# Patient Record
Sex: Male | Born: 2000
Health system: Southern US, Community
[De-identification: ages and names within clinical notes are randomized; demographics above are authoritative.]

---
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
## 2018-01-21 ENCOUNTER — Inpatient Hospital Stay: Attending: Psychiatry

## 2018-01-22 ENCOUNTER — Inpatient Hospital Stay
Admit: 2018-01-22 | Discharge: 2018-01-22 | Disposition: A | Discharge status: Home / Self Care | Payer: PRIVATE HEALTH INSURANCE | Source: Home / Self Care

## 2018-01-22 DIAGNOSIS — R4589 Other symptoms and signs involving emotional state: Secondary | ICD-10-CM

## 2018-01-22 DIAGNOSIS — G4700 Insomnia, unspecified: Secondary | ICD-10-CM

## 2018-01-22 LAB — Acetaminophen,Serum: ACETAMINOPHEN,SERUM: <10 ug/mL — ABNORMAL LOW (ref 10–20)

## 2018-01-22 LAB — Extra Gold Top

## 2018-01-22 LAB — Electrolyte Panel: SODIUM: 137 mmol/L (ref 135–146)

## 2018-01-22 LAB — Glucose, Whole Blood: GLUCOSE, WHOLE BLOOD: 90 mg/dL (ref 65–99)

## 2018-01-22 LAB — CBC: RED CELL DISTRIBUTION WIDTH-SD: 40 fL (ref 36.9–48.3)

## 2018-01-22 LAB — CREATININE: CREATININE: 0.74 mg/dL (ref 0.60–1.30)

## 2018-01-22 MED ORDER — GABAPENTIN 300 MG PO CAPS
300 mg | ORAL_CAPSULE | Freq: Three times a day (TID) | ORAL | 0 refills | Status: AC
Start: 2018-01-22 — End: 2018-03-05

## 2018-01-22 MED ORDER — TRAZODONE HCL 50 MG PO TABS
50 mg | ORAL_TABLET | Freq: Every evening | ORAL | 0 refills | Status: SS
Start: 2018-01-22 — End: 2018-05-15

## 2018-01-22 NOTE — ED Notes
Registration called for discharge x2

## 2018-01-22 NOTE — Consults
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    Chief Complaint: ''I'm having really bad anxiety''    History of Present Illness:    Alan Glover reports he was brought to the emergency department today because of a panic attack last night where he felt like the world was crashing down on him, was pacing up and down his dorm hallways for 30 min, and began having thoughts of self-harm and suicide due to the significant amount of anxiety was feeling. Had passing thoughts of wanting to jump off a building, however reports that she has no intention on acting on these thoughts, ???I would never kill myself, I don't think I have the guts to do that.???  Says that he was more concerned about thoughts of wanting to cut or scratch himself in order to relieve the anxiety, however he did not do so. He does not have any history of suicide attempts or self-harm behavior. Reports that this was his 2nd panic attack in his lifetime, the first one being about 3 years ago when he first caught his parents cheating on each other.    [name] describes worsening anxiety over the last three weeks since he returned to [place] from winter break in [place] with his parents. Says he feels anxious especially in the nighttime, however is unable to identify a trigger. Reports that overall his life is going ???really good??? and that he is does not feel especially homesick, nor are his classes stressful at the moment, and so he is surprised that he has struggled with so much anxiety recently. He reports the biggest stressor in his life being catching his parents cheating on each other several years ago, however he is unsure why the stress of that situation would cause increased anxiety right now. He has been self-medicating his anxiety with marijuana, currently smoking marijuana 4x/day for the past year. He stopped smoking marijuana 4 days ago to see if it would relieve his anxiety, but did not notice any change.     His biggest concern in the emergency room tonight is his recent difficulty with sleeping, saying he has only been getting 1-2 hours of sleep a night for the last 3 weeks and that it is difficulty falling asleep and staying asleep.  Endorses that this may be contributing to his anxiety and resultant poor appetite, especially as his anxiety is typically at night. He reports he was depressed three months ago when he first started college because he was homesick and in a new environment and his girlfriend had just broken up with him, however denies a depressed mood recently. Does endorse decreased interest in activities that he previously enjoyed such as sports, and worse concentration in his classes, however he has continued to obtain straight As during his first quarter at [employer].  He reports he saw a counselor at [facility] when he first came to [place], but has not followed up with a therapist after  his sessions were completed.  Has never seen a psychiatrist, nor has he ever been on any psychiatric medications.      Collateral Contact Information:  Extended Emergency Contact Information  Primary Emergency Contact: Armando Reichert [place]  Mobile Phone: [phone]  Relation: Father  Outpatient Psychiatrist: None  Outpatient Therapist: Previously seen at [facility] in fall 2018, none currently      Psychiatric History:   - Psychiatric diagnoses: unspecified depression - Outpatient treatment: [facility] therapy at [facility]  - Prior psychiatric medication trials: none  - Prior psychiatric hospitalizations: none  - Prior suicide attempts: none  - History of self-injurious behavior: none  - History of assaultive or violent behavior: none    Substance History:   Drinks alcohol 3-4x/week, approx 6 beers or hard alcohol drinks  Smokes marijuana 4x/day for over a year  Smokes nicotine in a vape pen for over a year but says he stopped smoking 3 weeks ago  Reports trying cocaine 2x in past year, does not use regularly    Alcohol Screen:  The patient was screened by a physician with a validated tool and the score on the alcohol screen indicates unhealthy alcohol use (moderate risk) benefiting from brief intervention.    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  From [place], parents live there, but has an uncle and aunt here in [place], uncle is a pediatric neurologist here at [facility]. Received straight A's his 1st quarter at [employer].    Family History:  Reports mother with history of depression, thinks she has taken Xanax in the past but does not note any antidepressants.    Past Medical History:  There is no problem list on file for this patient.    Allergies and Adverse Drug Reactions:  No Known Allergies  No current Epic-ordered facility-administered medications on file.      No current Epic-ordered outpatient prescriptions on file.       Medication Reconciliation: Medication list above verified and updated by me.    Review of Systems:   General: no fevers, chills, or weight loss  HEENT: no changes in vision or hearing, no sore throat, no sinus congestion   CV/PULM: no chest pain, palpitations, shortness of breath, or cough   GI/GU: no nausea, vomiting, abdominal pain, dysuria  Skin: no edema or rash   Neuro: no headache, focal weakness, numbness, or gait abnormality     Physical Examination:  Vitals:    01/21/18 2056   BP: 128/70   Pulse: 62   Resp: 20 Temp: 37.2 ???C (98.9 ???F)   TempSrc: Oral   SpO2: 96%   Weight: 170 lb (77.1 kg)   Height: 1.88 m (6' 2'')      GEN: NAD, A&O  HEENT: NCAT, PERRL, EOMI, MMM  CV: RRR,???no M/R/G  Lungs:???CTAB  Abd: Soft,???NT, ND  Extr: No???C/C/E  Neuro: CN II pupils were equally round and reactive to light, visual fields full, CN III, IV, VI: extraocular movements are intact, CN V facial sensation is intact, CN VII face is symmetric, CN VIII hearing to finger rub is intact bilaterally, CN IX, X uvula is midline, palate raises symmetrically, CN XI shoulder shrug intact, CN XII tongue midline, 5/5 strength throughout BUE and BLE, gait not assessed, sensation to LT intact throughout BUE and BLE    Laboratory Data and Studies:  No results found for this or any previous visit (from the past 24 hour(s)).  Recent Imaging Results:  No results found.    Mental Status Examination:   Appearance: Well-appearing, wearing hospital gown, appears stated age, well-groomed, with good hygiene  Behavior: Calm and cooperative, sitting up in bed, with good eye contact.  Motor: No psychomotor retardation/agitation, tremors, or tics noted  Speech: Normal rate, rhythm, volume and tone  Mood: ''I'm feeling ok now, but I think that's because I'm in the hospital''  Affect: Euthymic, full  Thought Process: Linear, well-organized  Thought Content: denies SI/HI     Perception: No AH or VH  Orientation: To person, place, time and situation.  Memory: Intact as evidenced by ability to recall precipitating and distant events  Concentration and Attention: Intact as evidenced by ability to fully engage in interview  Intellect/Fund of Knowledge: Average   Insight:  Fair as evidenced by adequate understanding of condition  Judgment: Fair as evidenced by constructive treatment-seeking behavior, cooperation with treatment team and staff , seeking out social supports in a constructive manner  and contracting for safety with regard to thoughts or urges of self-harm Assets & Strengths:  Supportive family, help seeking, able to contract for safety at this time     Suicide Risk Assessment:  Risk and protective factors:  1. Suicidal ideation:     None / denies SI   2. Intent to act upon thoughts of suicide:     Denies intent   3. Firearms:     No access to a firearm   4. Access to other stated means and environmental factors:     None   5. Recent suicidal behaviors or preparatory acts:     None   6. Prior suicide attempts:     None   7. Self-directed violence without suicidal intent:     None   8. Other key symptoms and dynamic risk factors:     None   9. Protective factors:    (Absences of risk factors are also considered proctective factors)  Support from family, friends, or others  Engagement in work, school, or other  Help seeking behaviors     Risk formulation and interventions:  10. Baseline chronic risk   Compared to the general population, the patient's baseline chronic suicide risk is estimated to be:    Minimal baseline risk, equivalent to general population   11. Acute risk  The patient's current, acute risk is estimated to be:    Mildly elevated above his baseline   12. Risk mitigation and interventions:    Patient engaged in his safety and treatment planning  Appropriate treatment has been initiated or ongoing  Developed a plan to manage stressors  Thoughts of suicide have decreased   13. Additional considerations:    None   14. 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 He describes a panic attack last  night with feelings of the world crashing down on him, thought he was going to die, and feeling depersonalized.  He reported thoughts of self-harm of scratching or cutting himself at the time and fleeting thoughts of jumping off a bridge to kill himself, however reports that these thoughts have resolved and denies suicidal thoughts at this time. He is able to contract for safety, saying he would never kill himself, and would call either family or 911 for help if he had these thoughts in the future.     On interview, he describes feelings of anxiety at night, described as restlessness and a fear of being unable to sleep, but is unable to specify other triggers.  His mental status exam is grossly normal with good eye contact, normal speech, euthymic affect, and a logical and linear thought process. His current presentation is suggestive of an unspecified anxiety disorder, however differential includes substance induced mood disorder given his significant marijuana use versus depression with anxious features given his poor sleep, appetite, interest in activities and concentration.     At this time, Broden does not meet criteria for inpatient hospitalization, nor LPS hold as he is not a danger to himself, to others, or gravely disabled at this time.  He would benefit from outpatient psychiatry and therapy follow-up for his anxiety and insomnia.   He is accompanied by his aunt, and I spoke with his uncle Dr. Irish Elders  (pediatric neurologist at [facility] [phone]), and they feel comfortable taking him to their home for increased support while he tries to arrange outpatient care. Artice was provided resources to [employer] here at [facility] for outpatient psychiatry follow up. In the meanwhile, he was prescribed trazodone and gabapentin to address his insomnia and anxiety while he awaits an outpatient appointment, risks and benefits of the medications were discussed. We also discussed at length the importance of decreasing his marijuana and alcohol use as they could be contributing to his mood symptoms, and especially avoiding alcohol while using gabapentin. Patient expressed understanding and agreement with this plan    Psychiatry:   Start trazodone 50mg  PO qhs prn insomnia  Start gabapentin 300mg  PO TID prn anxiety or insomnia  Risks and benefits were discussed    Medical: No acute or chronic medical issues.   Dispo: Will discharge patient home with aunt, with plan for patient and family to arrange outpatient psychiatry follow up tomorrow AM. Patient provided return precautions to ED should symptoms worsen.     Thank you for involving [facility] in this case. Please page [phone] for further questions or concerns.     Note written by Marsh Dolly, MD, PGY-I. This patient was discussed with Dr. Helyn App, attending psychiatrist, with whom the above assessment and plan were jointly formulated. Recommendations and plan were discussed with referring physician.         This case was presented to me by the resident physician. We jointly formulated the plan of care. I agree with the plan as documented in the resident's note. Darliss Ridgel, MD

## 2018-01-22 NOTE — ED Provider Notes
[name]  Emergency Department Service Report    Triage     Alan Glover, a 17 y.o. male, presents with Psychiatric Evaluation (Has been depressed, anxious and intermittently suicidal for 2 yrs, symptoms worsened 2 wks ago ''I'll do something if I dont get this addressed right now'' denies medical problems, brought in by his aunt )    Arrived on 01/21/2018 at 8:29 PM   Arrived by Car [5]    ED Triage Vitals   Temp Temp Source BP Heart Rate Resp SpO2 O2 Device Pain Score Weight   01/21/18 2056 01/21/18 2056 01/21/18 2056 01/21/18 2056 01/21/18 2056 01/21/18 2056 01/21/18 2056 01/21/18 2058 01/21/18 2056   37.2 ???C (98.9 ???F) Oral 128/70 62 20 96 % None (Room air) Zero 77.1 kg (170 lb)       No Known Allergies     Initial Physician Contact       Comprehensive Exam Initiated  Contact Date: 01/21/18  Contact Time: 2229    History   HPI    13M h/o MDD, not on medications, who is p/w suicidal ideation. States that his depression has been ''out of control'' for ''months'', however, worsening over the past 2 weeks, without an inciting trigger. For the past 2d, he has been having thoughts of jumping off a bridge or ''finding another way to kill himself.'' He denies ingestions, specifically no ACAP or ASA. He drinks alcohol 3-4 nights per week; approx 6 beers or hard alcohol drinks; he denies h/o withdrawal and currently denies tremors, palpitations, or AVH. He smokes marijuana up to 4 times per day.   +Insomnia, thoughts of hopelessness, internal dialogue telling him he's worthless.  Denies HI, AVH.    No HA, neck pain, weakness, numbness, vision change, n/v, abd pain, changes in urination, fevers, rash, or other symptoms.     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 [facility]; ''I was depressed because of family stuff.'' No h/o 5150 or inpt psych.     History reviewed. No pertinent past medical history.     History reviewed. No pertinent surgical history.     Past Family History family history is not on file.     Past Social History   he has no tobacco, alcohol, drug, and sexual activity history on file.     Review of Systems    Physical Exam   Physical Exam    Medical Decision Making   Alan Glover is a 17 y.o. male ***        Chart Review   Previous medical records requested.  Pertinent items reviewed: ***    ED Course      Laboratory Results   Labs Reviewed - No data to display    Imaging Results     No orders to display       Consults       Time               Service  ***:     {plan; consultations:24168}    Progress Notes / Reassessments          Time               Notes    11PM:    Psych will evaluate.     ***:  ***    ED Procedure Notes   ***    Any ED procedures performed are documented on separate ED procedure notes.    Clinical  Impression   No diagnosis found.    Disposition and Follow-up   Disposition:     No future appointments.    Follow up with:  No follow-up provider specified.    Return precautions are specified on After Visit Summary.    New Prescriptions    No medications on file       Scribe Signature   I, ***, have acted as a Stage manager for [employer] on behalf of Dr. Marland Kitchen  @ on 01/21/2018 at 10:29 PM.        Resident Signature

## 2018-01-22 NOTE — ED Notes
Registration called for discharge x1

## 2018-01-22 NOTE — ED Notes
Patient c/o of diminished appetite, insomnia, obsessive negative thoughts, and increased anxiety, especially during the past month. Patient has trouble sleeping without alcohol and marijuana use. Patient is concerned about his substance use.    Depressive symptoms started after catching his mother cheating on his father, ''in the act''. Also witnessed his father during the same.    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    States he has, ''fleeting thoughts'' of suicide, thinks about jumping off a building, although he does not a plan of how to do so.

## 2018-01-22 NOTE — ED Notes
Pt placed on a hold, advised on same as well as plan of care, pt and aunt agreeable with the same. Security called for routine search.

## 2018-01-23 ENCOUNTER — Ambulatory Visit: Payer: PRIVATE HEALTH INSURANCE

## 2018-01-25 ENCOUNTER — Ambulatory Visit: Payer: PRIVATE HEALTH INSURANCE

## 2018-01-25 DIAGNOSIS — F419 Anxiety disorder, unspecified: Secondary | ICD-10-CM

## 2018-01-25 DIAGNOSIS — Z113 Encounter for screening for infections with a predominantly sexual mode of transmission: Secondary | ICD-10-CM

## 2018-01-25 DIAGNOSIS — Z0000 Encounter for general adult medical examination without abnormal findings: Secondary | ICD-10-CM

## 2018-01-25 NOTE — Progress Notes
PATIENT: Alan Glover  MRN: [id number]  DOB: Jun 29, 2001   DATE OF SERVICE: 01/25/2018     Subjective:      History was provided by the patient .    Alan Glover is a 17 y.o. male who comes in for establishing care, ED follow up.    The following portions of the patient's history were reviewed and updated as appropriate: problem list, allergies, current medications, past family history, past medical history, past social history, past surgical history.    Symptoms of anxiety first starting just prior to going to college.  All of his friends left 1 mo before him so he had a lot of time to just think about the upcoming change of moving from [place] to [place].  Smoked weed daily during this time.  Once he got to school had a little bit of sadness/anxiety from just being in a new place.  Then starting pledging a fraternity and felt like he had a good group of friends and that things were going pretty well but once he got home for holiday break realized the chronic anxiety he had been having and told his parents he thought he should see a physician, but never saw one. Has been having lots of issues with sleep, only sleeping 4-6 hours a night due to taking hours to fall asleep.  Also had noticed weight loss, about 15 lb associated with loss of appetite.  Despite these symptoms got mostly all A's last semester and has a good social network.  3 weeks prior to going to ED on 1/28, was having anxeity every night at 9 pm for no obvious reason.  Anxiety was unbearable but ''not dangerous''.  Would smoke weed to help, around 3x per day.  Decided the weed was probably not helping so stopped smoking 3 days before going to ED.  That was a weekend so he went out to drink socially those nights so wasn't feeling as anxious.  Sunday night started feeling anxious but didn't want to smoke.  Felt like he couldn't sit still so started pacing and crying.  Was having overwhelming feelings of the world crashing down on him.  Had fleeting suicidal thoughts and considered cutting himself.  This went on for over 2 hours.  Roommate found him and convinced him to smoke weed to help calm him down, which helped and he went to bed.  The next day told his aunt and uncle, who are local, and they brought him into the ED for an evaluation.  Has been staying with them since.  After ED, started on trazadone 50 mg qHS and gabapentin 300 mg TID.  Trazadone by itself does not help with sleep but trazadone + meditation seems to help.  Gabapentin also seems to be helping with anxiety.  Has noticed that he wakes up with night sweats sometimes which is new since starting the medications.  No sick symptoms.    Current Issues:   See above.    Nutrition:   Poor appetite but improved slightly in the past few days.    Current Medications  Medications that the patient states to be currently taking   Medication Sig   ??? gabapentin 300 mg capsule Take 1 capsule (300 mg total) by mouth three (3) times daily.   ??? traZODone 50 mg tablet Take 1 tablet (50 mg total) by mouth at bedtime.      Review of Systems:  General: positive for - fatigue, sleep disturbance and weight loss  ENT: negative  Ophthalmic: negative - red/green color blindness  Respiratory: no cough, shortness of breath, or wheezing  Cardiovascular: no chest pain or dyspnea on exertion  Gastrointestinal: no abdominal pain, change in bowel habits, or black or bloody stools  Genito-Urinary ROS: no dysuria, trouble voiding, or hematuria  Dermatological: negative  Allergy and Immunology: negative  Hematological and Lymphatic: negative  Endocrine: negative  Musculoskeletal: negative  Neurological: negative  Development/Psychological: positive for - anxiety and sleep disturbances    Sleep:   Sleeping concerns: Yes , see above    Social Screening:   Home concerns: No  Education: no concerns, Freshman, school name: Occupational psychologist, performance doing well; no concerns   Activities: fraternity Tobacco/alcohol/drugs: previously daily marijuana, has since stopped, socially drinks but denies drinking to excess, has tried cocaine but does not use regularly  Sexual activity: sexually active with women, uses protection, has not been checked for STDs  Emotional well being: concerns for anxiety, no active SI/HI at this time  Safety/Exposures reviewed. Concerns? No        Objective:    Growth parameters are noted and are appropriate for age.  BP 112/71  ~ Pulse 99  ~ Temp 36.8 ???C (Tympanic)  ~ Ht 1.848 m (6' 0.76'')  ~ Wt 74.5 kg (164 lb 3.9 oz)  ~ BMI 21.81 kg/m???    40 %ile (Z= -0.25) based on CDC 2-20 Years BMI-for-age data using vitals from 01/25/2018.   67 %ile (Z= 0.44) based on CDC 2-20 Years weight-for-age data using vitals from 01/25/2018.  88 %ile (Z= 1.15) based on CDC 2-20 Years stature-for-age data using vitals from 01/25/2018.     General:   alert and no distress   Gait:   normal   Skin:  normal   Oral cavity:   lips, mucosa, and tongue normal; teeth and gums normal   Eyes:   sclerae white, pupils equal and reactive   Ears:   normal external ear   Nose nasal mucosa, septum, turbinates normal bilaterally   Neck:   supple, no adenopathy and no masses   Chest/Breast Normal   Lungs:  clear to auscultation bilaterally   Heart:   regular rate and rhythm, S1, S2 normal, no murmur, click, rub or gallop   Abdomen:  soft, non-tender; bowel sounds normal; no masses,  no organomegaly   Back: not examined   GU:  exam deferred   Musculoskel Normal symmetric bulk and strength   Extremities:   extremities normal, atraumatic, no cyanosis or edema   Neuro:  normal without focal findings, mental status, speech normal, alert and oriented x3 and PERLA          LABS: No results found for: CHOL, CHOLDLCAL, CHOLHDL, TRIGLY, NOHDLCHOCAL  No results found for: HGBA1C  No results found for: TSH, T4TOTAL     Assessment:   Alan Glover is a 17 y.o. male presenting to establish care, follow up from ED for anxiety attack.  Currently seems to be doing better with trazadone and gabapentin.    .   Plan:        1. Anticipatory guidance discussed.  Specific topics reviewed: drugs, ETOH, and tobacco and importance of regular exercise.    2. Growth/nutrition: weight loss in setting of anxiety  - Will CTM    3. Development/School: no concerns    4. Immunizations: UTD per patient, yet to be confirmed.    5. Screening tests:  STD screening: chlamydia, GC, HIV and syphilis    6. Other issues:   #  Anxiety  - Seen by our psychologist, went over several techniques for anxiety  - Continue trazadone and gabapentin  - Continue medication  - Follow up with CBT resources from school  - Discussed effect drugs and alcohol have on anxiety  - Discussed effect regular exercise has on anxiety    7.  Follow up visit in 1 mo.  Patient seen and plan of care discussed with General Pediatric Attending, Dr. Juleen Starr, MD 01/25/2018 2:53 PM   Pediatric Resident

## 2018-01-25 NOTE — Patient Instructions
You can contact Dr Dareen Piano via [facility] anytime you need to.

## 2018-01-25 NOTE — Progress Notes
Patient Name: Alan Glover  Date of Birth: 21-Jun-2001  Attending MD: Dr. Delano Metz  Amount of time spent: 20 minutes  ???  Please see Dr. Ewell Poe note for a complete history, results of the physical exam, and medical plan. The following assessment was conducted by the Child Psychiatry Consult-Liaison service concurrently with the medical evaluation today and is focused on emotional and behavioral functioning.   ???  Chief complaint: ''Extreme anxiety, and I definitely don't want to keep feeling this way''  ???  Subjective:   Alan Glover reports a history of anxiety from a young age that was historically manageable but recently become extreme and impairing. He reports several instances of trauma and familial stress in childhood and adolescence (death of sister at age 47, witnessing parents being unfaithful and being ignored upon confronting them age 5-17).  He reports historically ignoring or avoiding stressors and unpleasant emotions.  Alan Glover reports the current difficulties began during a period of one month of high anxiety prior to moving to [place] for college, stating that his friends left for their schools early and in the absence of social support he began using marijuana to cope with his anticipatory anxiety regarding the move and beginning school. He reports good grades and adequate social support since beginning school but daily use of marijuana to reduce anxiety, particularly on school nights, stating his anxiety heightens predictably at 9pm. He reports frequent negative self talk (at times ''even mumbling out loud to myself'' and feelings of guilt regarding productivity. He denies low mood, depressive symptoms or SI but endorsed decreased appetite and insomnia as a result of anxious feelings and thoughts.  Alan Glover reports prior use of [facility] therapy for three sessions after which he was referred due to insurance and did follow up as severity was perceived low. He reports that over past weekend (01/20/18)  he was alone as anxiety build and he became quite agitated, pacing and with racing thoughts and negative self talk and tearfulness. He reports he felt out of control and as if the world would end. Alan Glover states that he went into his bathroom and had urges to use a knife to cut himself. He reports previously fleeting thoughts of NSSI but no strong urges. He reports he was able to resist cutting himself and after this his roommate returned and suggested he smoke marijuana, after which he was able to fall asleep. He reports he went independently to the ER the following day, concerned that his urges the night before could return. He was prescribed gabapentin 300mg  3/day and trazodone 50mg  for sleep and reported relief from these medications as well as use of meditation app (Insight Timer) at bedtime, however was concerned about long term reliance on medication as symptoms return in medication interim.    ???Interventions:  - Provided psychoeducation about anxiety and avoidance cycle and substance use  -Provided brief psychoeducation regarding negative cognitions, repeated negative self talk and reframing  -Introduced Progressive and Paired Muscle Relaxation  -Introduced Mindfulness of 5 Senses self soothing exercise  -Recommended initiation of individual therapy referral for further coping and processing practice and techniques  ???  Mental Status Examination:   Appearance: ????????????Adequately groomed, comfortably dressed???  Appears:?????????????????????????????????Stated age  Eye Contact:????????? Fair, intermittent  Behavior and Motor Activity: hypermotor agitation in limbs and fidgeting  Attitude: ?????????????????????????????????Forthcoming, anxious  Speech: ??????????????????????????????Pressured rate, normal volume and pitch  Affect: ??????????????????????????????????????????Anxious???  Mood: ????????????????????????????????????????????????Anxious  Thought Process: Grossly linear  Thought Content: Focused on: the discomfort of his anxiety  Perception: ??????????????????No  Abnormalities Noted Cognition: ?????????????????????Intellectual Functioning/Fund of Knowledge: ???High Average - As Evidenced by: [employer], reported good grades  Judgment: ?????????????????????Fair: reported desire to remain abstinent from marijuana and seek help after anxiety attack   Insight: ????????????????????????????????????Fair: reported beginning to understand connection between stressful life events, avoidance and increased anxiety  ???    Impression: ???  Alan Glover presents as highly anxious having not sought treatment prior as he attempted to self manage. He lacks insight into adaptive coping skills at present and historically relied on avoidance or distraction via substance use which resulted in anxious attacks in the absence of social support. He reported regular negative and anxious self talk perpetuating and exacerbating his anxious symptoms. He presents as highly motivated for relief and receptive to treatment and coping recommendations, as well as self-imposed abstinence from marijuana.   ???  DSM-V Diagnosis:  300.02 Generalized Anxiety Disorder  292.00 Cannabis Withdrawal    ???  Recommendations:   - Repeated practice of self soothing and paired muscle relaxation  - Practice of awareness and reframe of anxious self-talk  - Resume individual therapy to increase knowledge of adaptive coping skills  ???  Plan:  Continue to monitor anxiety symptomatology and NSSI urges in the context of overwhelm. Continue current medication regimen.  ???  The above plan was discussed with Dr. Delano Metz, under the supervision of Tally Joe, PhD.  ???  Please contact Lincoln Maxin, M.A. [phone], [phone]) for questions or should additional requests arise.  ???  Lincoln Maxin, M.A.  Pager [phone]  Adolescent Serious Mental Illness Intern

## 2018-05-10 ENCOUNTER — Inpatient Hospital Stay
Admit: 2018-05-10 | Discharge: 2018-05-10 | Disposition: A | Discharge status: Other Acute Inpatient Hospital | Payer: PRIVATE HEALTH INSURANCE | Source: Home / Self Care

## 2018-05-10 DIAGNOSIS — F329 Major depressive disorder, single episode, unspecified: Secondary | ICD-10-CM | POA: Insufficient documentation

## 2018-05-10 DIAGNOSIS — F32A Depression, unspecified: Secondary | ICD-10-CM | POA: Insufficient documentation

## 2018-05-10 DIAGNOSIS — R45851 Suicidal ideations: Secondary | ICD-10-CM | POA: Insufficient documentation

## 2018-05-10 LAB — CBC: ABSOLUTE NUCLEATED RBC COUNT: 0 10*3/uL (ref 0.00–0.00)

## 2018-05-10 LAB — UA,Dipstick: NITRITE: NEGATIVE (ref 1.005–1.030)

## 2018-05-10 LAB — Electrolyte Panel
SODIUM: 143 mmol/L (ref 135–146)
TOTAL CO2: 17 mmol/L — ABNORMAL LOW (ref 20–30)

## 2018-05-10 LAB — Glomerular Filtration Rate Estimate

## 2018-05-10 LAB — Glucose: GLUCOSE: 93 mg/dL (ref 65–99)

## 2018-05-10 LAB — Drugs of Abuse Scn,Ur: COCAINE: NEGATIVE ng/mL

## 2018-05-10 MED ADMIN — OLANZAPINE 10 MG IM SOLR: 10 mg | INTRAMUSCULAR | @ 09:00:00 | Stop: 2018-05-10 | NDC 00002759701

## 2018-05-10 NOTE — ED Notes
MD at bedside. Psych. Pt too drowsy to talk.

## 2018-05-10 NOTE — ED Notes
Placed condom cath. Changed pt bed linen.

## 2018-05-10 NOTE — ED Notes
Report given to RN Anthony. Pt stable.

## 2018-05-10 NOTE — ED Provider Notes
[name]  Emergency Department Service Report    Triage     Alan Glover, a 17 y.o. male, presents with Psychiatric Evaluation (+SI. Brought by [facility] on 5150. +etoh. Told friends would jump off roof)    Arrived on 05/10/2018 at 12:58 AM   Arrived by [facility] [[id number]]    ED Triage Vitals   Temp Temp Source BP Heart Rate Resp SpO2 O2 Device Pain Score Weight   05/10/18 0455 05/10/18 0455 05/10/18 0059 05/10/18 0059 05/10/18 0059 05/10/18 0059 05/10/18 0059 05/10/18 0059 05/10/18 0059   36.1 ???C (97 ???F) Oral 136/85 145 22 99 % None (Room air) Zero 72.6 kg (160 lb)       No Known Allergies     Initial Physician Contact       Comprehensive Exam Initiated  Contact Date: 05/10/18  Contact Time: 0052    History   HPI  60M with history of anxiety BIB [facility] on 5150 for DTS after telling friends that he was going to jump off roof. Friends had to physically restrain him. Patient is verbally aggressive, currently in restraints upon my interview, refuses to participate in interview.       Past Medical History:   Diagnosis Date   ??? Anxiety    ??? Red-green color blindness         History reviewed. No pertinent surgical history.     Past Family History   family history is not on file.     Past Social History   he has no tobacco, alcohol, drug, and sexual activity history on file.     Review of Systems   Unable to perform ROS: Psychiatric disorder       Physical Exam   Physical Exam  GEN: Well-developed and well-nourished. No acute distress. In 4 point restraints.   HENT: Normocephalic and atraumatic. Oropharynx is clear and moist. Conjunctivae are normal.   PULM: Effort normal. No respiratory distress.   ABD: No distension.    NEURO: Alert and oriented to person, place, and time. Normal speech.   DERM: Skin is warm and dry. No rash noted.   PSYCH: Aggresive affect.   Nursing note and vitals reviewed.    Medical Decision Making   Alan Glover is a 17 y.o. male with history of anxiety BIB [facility] on 5150 for DTS after telling friends that he was going to jump off roof. Refuses to engage with conversation.   Plan admit to psychiatry for further management of symptoms. Clinically no overt toxidrome, well appearing, low suspicion for ingestion given hx and exam.           Chart Review   Previous medical records requested.  Pertinent items reviewed.     ED Course      Laboratory Results     Labs Reviewed   CBC - Abnormal; Notable for the following:        Result Value    White Blood Cell Count 12.52 (*)     All other components within normal limits   ELECTROLYTE PANEL - Abnormal; Notable for the following:     Total CO2 17 (*)     Anion Gap 22 (*)     All other components within normal limits   DRUGS OF ABUSE SCN,UR - Abnormal; Notable for the following:     Cannabinoids Positive (*)     Ethanol Positive (*)     All other components within normal limits    Narrative:  If the screen result is not consistent with the patient's medication(s), confirmation testing should be ordered for the drug(s) of interest.    Unconfirmed screening results should be used only for medical (i.e., treatment) purposes and not for non-medical purposes (e.g., employment testing, legal testing.)    Phencyclidine testing is not included in the drug screen. It can be ordered separately (test [phone]).   UA,DIPSTICK - Normal   GLUCOSE - Normal   GLOMERULAR FILTRATION RATE ESTIMATE       Imaging Results     No orders to display         Progress Notes / Reassessments     ED Course as of May 10 912   Fri May 10, 2018   0454 Patient seen by psychiatry, placed on hold, will admit for board and transfer.   [NM]   0818 Alan Glover: Received sign out on this pt. Pending board and transfer, stable at this time, will reassess.  [ML]      ED Course User Index  [ML] Leta Speller., MD  [NM] Janina Mayo, MD       Clinical Impression     1. Suicidal ideation        Disposition and Follow-up   Disposition: Admit to Psych [14] No future appointments.    Follow up with:  No follow-up provider specified.    Return precautions are specified on After Visit Summary.    New Prescriptions    No medications on file       Orders Placed This Encounter   ??? CBC without differential   ??? Electrolyte Panel [Na, K, Cl, CO2]   ??? UA dipstick   ??? Creatinine (Est GFR)   ??? Glucose   ??? Urine dipstick   ??? Straight cath   ??? Bed Request to Behavioral Health   ??? Violent/self destructive (behavioral) restraint adult (Age 45 and older) RR/SM   ??? Arrived on 72 hour hold (authorized by Tax adviser or county [employer])   ??? 72 hour hold   ??? Urine drugs of abuse screen   ??? OLANZapine tab disolv 5 mg   ??? OLANZapine inj 10 mg   ??? gabapentin cap 300 mg   ??? FLUoxetine cap 20 mg           Resident Maggie Font, MD  Resident  05/10/18 0981       Janina Mayo, MD  Resident  05/10/18 [phone]    ATTENDING NOTE    I have performed a history and physical exam of this patient and discussed the management with the resident. I have reviewed the resident note and agree with the documented findings and plan of care.    Redge Gainer, MD        Sydnee Cabal., MD, MPH  05/12/18 2128

## 2018-05-10 NOTE — ED Notes
Pt now banging head against right arm restraint aggressively in attempt to hurt self, refusing to calm and follow verbal commands. Pt cursing and using foul language to all staff members.

## 2018-05-10 NOTE — ED Notes
Report given to Taylor, RN on [address] where a bed is ready to continue pt's care.

## 2018-05-10 NOTE — ED Notes
Pt pulled off condom cath.

## 2018-05-10 NOTE — ED Notes
Pt pulls shorts down, offered pt urinal, pt threw the urinal on the floor, pt wants to only urinate on self.

## 2018-05-10 NOTE — ED Notes
MD at bedside. ED Resident stated not to medicate pt at this time ''let him metabolize and alcohol wear off so psych can evaluate him when he is more calm and cooperative, just keep pt in restraints right now''. Pt refuse to speak to ED Resident.

## 2018-05-10 NOTE — Other
[place] of [place] - Health and Investment banker, corporate of Health Care Services   INVOLUNTARY PATIENT ADVISEMENT  (TO BE READ AND GIVEN TO THE  PATIENT AT TIME OF ADMISSION) Confidential Patient Information  See W&I Code Section 5328 and   HIPAA Priacy Rule 45 C.F.R. Section 164.508   Name of Facility     [name]   Patient???s Name     Alan Glover Admission Date     05/10/2018   Section 5150(h) of the Welfare and Institutions Code requires that each person admitted to a facility designated by the county for evaluation and treatment be given specific information orally and in writing, and in a language or modality accessible to the person and a record of the advisement be kept in the person???s medical record.   My name is Loyola Mast My position here is Resident physician   You are being placed in this psychiatric facility because it is our professional opinion, that as a result of a mental health disorder, you are likely to: (check applicable)   [X]  Harm yourself [  ] Harm someone else [  ] Be unable to be take care of your own food clothing or shelter   (List specific facts upon which the allegation of dangerous or gravely disabled due to mental health disorder is based, including pertinent facts arising from the admission interview):   We believe this is true because  You reported suicidal thoughts   You will be held for a period of up to 72 hours. This ( []  does not ) ( [x]  does) include weekends or holidays.   Your 72-hour period begins: 05/10/18 at 0100    (Time and Date)   Your 72-hour evaluation and treatment period will end at: 05/13/18 at 0100    (Time and Date)   You will be held for a period up to 72 hours. During the 72 hours you may also be transferred to another facility. You may request to be evaluated or treated at a facility of your choice. You may request to be evaluated or treated by a mental health professional of your choice. We cannot guarantee the facility or mental health professional you choose will be available, but we will honor your choice if we can.     During these 72 hours you will be evaluated by the facility staff, and you may be given treatment, including medications. It is possible for you to be released before the end of the 72 hours. But if the staff decides that you need continued treatment you can be held for a longer period of time. If you are held longer than 72 hours, you have the right to a lawyer and a qualified interpreter and a hearing before a judge. If you are unable to pay for the lawyer, then one will be provided to you free of charge.     If you have questions about your legal rights, you may contact the county Patients??? Rights Advocate at [phone] or [phone] (phone number of county Patients??? Art therapist).   Good cause for Incomplete Advisement N/A Date N/A   Advisement Completed by  (Electronically signed by)  Loyola Mast Position  Resident physician Language or Modality Used  Spoken English Date  05/10/2018   CC: Original to the Patient   DHCS 1802 (12/2012) [place] to the Patient???s Record

## 2018-05-10 NOTE — ED Notes
Pt urinated on bed, placed 2 large white chux pads under pt. Pt does not want to move around in the bed good enough to replace sheet.

## 2018-05-10 NOTE — Other
[facility] Department of Mental Health ??? MH 302 NCR   APPLICATION FOR ASSESSMENT, EVALUATION, AND CRISIS INTERVENTION OR PLACEMENT FOR EVALUATION AND TREATMENT  Confidential Client/Patient Information  See [employer] and Institutions Code (W & I ) Code, Section 5328 & HIPAA Privacy Rule 45 C.F.R. ??? [phone]  Welfare and Institutions Code (W&C Code), Section 5150(f) and (g), requires that each person, when first detained for psychiatric evaluation, be given certain specific information orally and a record be kept of the advisement by the evaluating facility. DETAINMENT ADVISEMENT    My name is ________[name]_________________________  I am a Doctor, general practice, etc.) with (Name of Agency). You are not under criminal arrest, but I am taking you for examination by mental health professionals at (Name of Facility)_____[facility]_______________________.     You will be told your rights by the mental health staff.     If taken into custody at his or her residence, the person shall also be told the following information     [X]  Advisement Complete [  ] Advisement Incomplete You may bring a few personal items with you which I will have to approve. Please inform me if you need   Good Cause for Incomplete Advisement assistance turning off any appliance or water. You can make a phone call and leave a note to tell your friends and/or family where you have been taken.   Advisement Completed By  Loyola Mast Position   Resident physician Language or Modality Used  Spoken English Date of Advisement  05/10/2018   To (name of 5150 designated facility) [name] at [facility]    Application is hereby made for the assessment and evaluation of Alan Glover     Residing at [address], [place], [place], [address], [place], for up to 72-hour assessment, evaluation, and crisis intervention or placement for evaluation and treatment at a designated facility pursuant to Section 5150 et seq. (adult) or Section 5585 et seq. (minor), of the W&I Code. If a minor, authorization for voluntary treatment is not available and to to the best of my knowledge, the legally responsible party appears to be/is: (Check one)  []  Parent;  []  Legal Guardian;  []  Juvenile Court under W&I Code 300;  []  Juvenile Court under W&I Code 601/ 602;  []  Conservator. If known, provide names, address and telephone number:   N/A     The above person???s condition was called to my attention under the following circumstances:    [facility], consultation to Psychiatry     I have probable cause to believe that the person is, as a result of a mental health disorder, a danger to others, a danger to himself/herself or gravely disabled because (state specific facts):   Patient with history of anxiety. Patient was brought in by [facility] police department after endorsing suicidal ideation.       Based upon the above information, there is probable cause to believe that said person is, as a result of mental disorder:   [X]  A danger to  himself/herself. [  ] A danger to others. [  ] Gravely disabled adult. [  ] Gravely disabled minor.   Minors only: []  Based upon the above information, it appears that there is probable cause to believe that authorization for voluntary treatment is not available.   Signature, title, and badge number of Tax adviser, professional person in charge or the facility designated by the county for evaluation and treatment,  member of the attending staff, designated members of a mobile crisis team, or professional person designated by the county. Date: 05/10/2018  Phone:  [phone]      (Electronically signed by) Loyola Mast Title  MD Badge  [phone]   Time: 1:27 AM    Name of Law Enforcement Agency or Evaluation Facility/Person:   [name] at [facility] Address of Law Enforcement Agency or Evaluation Facility/Person: [address], [place], [place] [address] For patients in medical ER???s, detention began:  Date: 05/10/18   Time: 0100      NOTIFICATIONS TO BE PROVIDED TO LAW ENFORCEMENT AGENCY   Notify (officer/unit & telephone #) N/A     NOTIFICATION OF PERSON???S RELEASE IS REQUESTED BY THE REFERRING PEACE OFFICER BECAUSE:   []  The person has been referred to the facility under circumstances which, based upon an allegation of facts regarding actions witnessed by the officer or another person, would support the filing of a criminal complaint.  []  Weapon was Engineer, drilling to Section 8102 W&I Code. Upon release, facility is required to provide notice to the person regarding the procedure to obtain return of any confiscated firearm pursuant to Section 8102 W&I Code.     SEE REVERSE SIDE REFERENCES AND DEFINITIONS   Original: Accompany Client to Assessment, Evaluation, and Crisis Intervention Location     Copy: 5150/5585 Initiator  or 5150/5585 Designated Facility          Reference: DHCS 1801 (03/2013)                                                                                                             Page 1 of 2   APPLICATION FOR ASSESSMENT, EVALUATION, AND CRISIS INTERVENTION OR PLACEMENT FOR EVALUATION AND TREATMENT      REFERENCES AND DEFINITIONS      ???Gravely Disabled??? means a condition in which a person, as a result of a mental disorder, is unable to provided for his or her basic personal needs for food, clothing and shelter.  SECTION 5008(h) W&I Code    ???Gravely Disabled Minor??? means a minor who, as a result of a mental disorder, is unable to use the elements of life which are essential to health, safety, and development, including food, clothing, and shelter, even though provided to the minor by others.  Intellectual disability, epilepsy, or other developmental disabilities, alcoholism, other drug abuse, or repeated antisocial behavior do not, by themselves, constitute a mental disorder.  SECTION 5585.25 W&I Code    ???Tax adviser??? means a duly sworn Tax adviser as that term is defined in Chapter 4.5 (commencing with Section 830) of Title 3 of Part 2 of the Penal Code who has completed the basic training course established by the Commission on Dance movement psychotherapist, or any Civil Service fast streamer or probation officer specified in Section 830.5 of the Penal Code when acting in relation to cases for which he or she has a legally mandated responsibility. SECTION 5008 (i) W&I Code    Section 5152.1 W&I Code  The professional person in charge of the facility providing 72-hour evaluation and treatment, or his or her designee, shall notify the county Secondary school teacher or the director's designee and the Tax adviser who makes the written application pursuant to Section 5150 or a person who is designated by the Patent examiner agency that employs the Tax adviser, when the person has been released after 72-hour detention, when the person is not detained, or when the person is released before the full period of allowable 72-hour detention if all the conditions apply:  (a)  The Tax adviser requests such notification at the time he or she makes the application and the Tax adviser certifies at that time in writing that the person has been referred to the facility under circumstances which, based upon an allegation of facts regarding actions witnessed by the officer or another person, would support the filing of a criminal complaint.   (b) The notice is limited to the person's name, address, date of admission for 72-hour evaluation and treatment, and date of release.      If a Emergency planning/management officer, Sales executive, or designee of the Sales executive, possesses any record of information obtained pursuant to the notification requirements of this section, the officer, agency, or designee shall destroy that record two years after the receipt of notification. Section 5152.2 W&I Code    Each Patent examiner agency within a county shall arrange with the county Secondary school teacher a method for giving prompt notification to English as a second language teacher pursuant to Section 5152.1 W&I Code.      Section 5585.50 W&I Code     The facility shall make every effort to notify the minor's parent or legal guardian as soon as possible after the minor is detained.  Section 5585.50 W&I Code.    A minor under the jurisdiction of the [employer] under Section 300 W&I Code, is due to abuse, neglect or exploitation.    A minor under the jurisdiction of the Juvenile Court under Section 601 W&I Code is due to being adjudged a ward of the court as a result of being out of parental control.    A minor under the jurisdiction of the Juvenile Court under Section 602 W&I Code is due to being adjudged a ward of the court because of crimes committed.     Section 8102 W&I Code (EXCERPTS FROM)     (a) Whenever a person who has been detained or apprehended for examination of his or her mental condition or who is a person described in Section 8100 or 8103, is found to own, have in his or her possession or under his or her control, any firearm whatsoever, or any deadly weapon, the firearm or other deadly weapon shall be confiscated by an IT consultant, who shall retain custody of the firearm or other deadly weapon.  ''Deadly weapon,'' as used in this section, has the meaning prescribed by Section 8100.  (b)(1) Upon confiscation of any firearm or other deadly weapon from a person who has been detained or apprehended for examination of his or her mental condition, the Tax adviser or law enforcement agency shall issue a receipt describing the deadly weapon or any firearm and listing any serial number or other identification on the firearm and shall notify the person of the procedure for the return, sale, transfer, or destruction of any firearm or other deadly weapon which has been confiscated. A Tax adviser or law enforcement agency that provides the receipt  and notification described in Section 33800 of the Penal Code satisfies the receipt and notice requirements.  (2) If the person is released, the professional person in charge of the facility, or his or her designee, shall notify the person of the procedure for the return of any firearm or other deadly weapon which may have been confiscated.   (3) Health facility personnel shall notify the confiscating law enforcement agency upon release of the detained person, and shall make a notation to the effect that the facility provided the required notice to the person regarding the procedure to obtain return of any confiscated firearm.     Health and Safety Code [phone] (d)     A person detained under this section in a medical emergency room shall be credited for the time detained, up to twenty-four hours, in the event he or she is placed on a 72-hour hold pursuant to Section 5150 of the Welfare and Institutions Code.     MH 302 NCR (07/09/13)   Reference: DHCS 1801 (03/2013)                                                                                                             Page 2 of 2

## 2018-05-10 NOTE — ED Notes
Pt resting quietly with lights dim. Pt unwilling to speak with staff and refused breakfsat.

## 2018-05-10 NOTE — ED Notes
TV turned on for patient. Pt alert oriented and freely conversing with this RN. No acute distress at this time.

## 2018-05-10 NOTE — ED Notes
MD at bedside. Psych. Pt refuses to speak to Psych.

## 2018-05-10 NOTE — Progress Notes
Psychiatry Consultation Progress Note 05/10/2018   Patient Name: Alan Glover   Patient MRN: [id number]   Date of Birth: 2001/10/13   Patient Location: RR02/RR02     Identifying Data / Reason for Consultation:  Alan Glover is a 17 y.o. male with past psychiatric history of GAD who was BIB [facility] on 5150 DTS.     Primary Team Contact Information:  Treatment Team     Provider Relationship Specialty Contact    Leta Speller., MD Primary - Resident Emergency Medicine  [phone]    Janina Mayo, MD Primary - Resident Emergency Medicine  [id number]    Pellicci, Rolanda Lundborg., RN Registered Nurse Emergency Medicine  [id number]    Heloise Ochoa., MD Attending --  [phone]    Ed, Psychiatry - Adult - Consult, MD Team Psychiatry, Adult  [phone]    Liasion, Psychiatry - Adult - Consult, MD Team Psychiatry, Adult  [phone]          Collateral Contact Information:  Extended Emergency Contact Information  Primary Emergency Contact: Armando Reichert [place]  Mobile Phone: [phone]  Relation: Father  Preferred language: English  Interpreter needed? No     Interval History:  Per RN report, required IM olanzapine 10mg  x1 yesterday for agitation, had to be put in 4 point restraints.     Subjective:  On exam, patient was irritable and minimally cooperative. He was not able to engage meaningfully in discharge planning.     Current Medications:    ??? [START ON 05/11/2018] FLUoxetine  20 mg Oral Daily   ??? [START ON 05/11/2018] gabapentin  300 mg Oral BID       PRN MEDS for [place], Kranz as of 05/10/18 1104   Medications 05/09/18 05/10/18    OLANZapine tab disolv 5 mg  Dose: 5 mg  Freq: Every 8 hours PRN Route: PO  PRN Reason: Agitation  Start: 05/10/18 0132   End: 06/09/18 0131                  Vitals (last 24 hours):   Vitals:    05/10/18 0059 05/10/18 0455 05/10/18 0921   BP: 136/85 97/64 114/60   Pulse: 145 50 80   Resp: 22 18 18    Temp:  36.1 ???C (97 ???F) 36.7 ???C (98.1 ???F)   TempSrc:  Oral Oral SpO2: 99% 98% 96%   Weight: 160 lb (72.6 kg)          Recent Laboratory Results:  Recent Results (from the past 24 hour(s))   CBC without differential    Collection Time: 05/10/18  3:01 AM   Result Value Ref Range    White Blood Cell Count 12.52 (H) 4.16 - 9.95 x10E3/uL    Red Blood Cell Count 5.70 4.41 - 5.95 x10E6/uL    Hemoglobin 15.5 13.5 - 17.1 g/dL    Hematocrit 64.4 03.4 - 52.0 %    Mean Corpuscular Volume 81.4 79.3 - 98.6 fL    Mean Corpuscular Hemoglobin 27.2 26.4 - 33.4 pg    MCH Concentration 33.4 31.5 - 35.5 g/dL    Red Cell Distribution Width-SD 37.2 36.9 - 48.3 fL    Red Cell Distribution Width-CV 12.7 11.1 - 15.5 %    Platelet Count, Auto 262 143 - 398 x10E3/uL    Mean Platelet Volume 11.6 9.3 - 13.0 fL    Nucleated RBC%, automated 0.0 No Ref. Range %    Absolute Nucleated RBC Count 0.00 0.00 - 0.00 x10E3/uL  Electrolyte Panel [Na, K, Cl, CO2]    Collection Time: 05/10/18  3:01 AM   Result Value Ref Range    Sodium 143 135 - 146 mmol/L    Potassium 3.7 3.6 - 5.3 mmol/L    Chloride 104 96 - 106 mmol/L    Total CO2 17 (L) 20 - 30 mmol/L    Anion Gap 22 (H) 8 - 19   Creatinine (Est GFR)    Collection Time: 05/10/18  3:01 AM   Result Value Ref Range    GFR Estimate for Non-African American >89 See GFR Additional Information    GFR Estimate for African American >89 See GFR Additional Information    GFR Additional Information See Comment     Creatinine 0.83 0.60 - 1.30 mg/dL   Glucose    Collection Time: 05/10/18  3:01 AM   Result Value Ref Range    Glucose 93 65 - 99 mg/dL   Urine drugs of abuse screen    Collection Time: 05/10/18  5:57 AM   Result Value Ref Range    Amphetamine/Meth Negative detection limit at 1000 ng/mL    Barbiturates Negative detection limit at 200 ng/mL    Benzodiazepines Negative detection limit at 200 ng/mL    Cannabinoids Positive (A) detection limit at 50 ng/mL    Cocaine Negative detection limit at 300 ng/mL    Methadone Negative detection limit at 300 ng/mL Opiates Negative detection limit at 300 ng/mL    Oxycodone Negative detection limit at 100 ng/mL    Ethanol Positive (A) detection limit at 10 mg/dL   UA dipstick    Collection Time: 05/10/18  5:57 AM   Result Value Ref Range    Urine Color Straw      Specific Gravity 1.008 1.005 - 1.030    pH,Urine 6.0 5.0 - 8.0    Blood Negative Negative    Bilirubin Negative Negative    Ketones Negative Negative    Glucose Negative Negative    Protein Negative Negative    Leukocyte Esterase Negative Negative    Nitrite Negative Negative       Recent Imaging Results:  No imaging has been resulted in the last 24 hours    Mental Status Examination:   Appearance: disheveled, appears stated age.   Behavior: uncooperative  Motor: +psychomotor retardation   Speech: hypoverbal  Mood: irritable  Affect: dysthymic, constricted  Thought process: superficially linear  Thought content: no SI, HI, AVH endorsed.   Concentration and attention: intact as evidenced by ability to appropriately answer questions throughout interview.  Intellect/Fund of knowledge: Average as evidenced by use of language, vocabulary and educational background.  Insight: Poor  Judgment: Poor    Diagnostic Impression:  Unspecified mood d/o  Polysubstance use disorder (cannabis, etoh)  H/o GAD    Assessment and Recommendations:  Alan Glover is a 17 y.o. male with a past psychiatric history of GAD , panic attacks who was BIB [facility] on 5150 DTS. Per police hold, patient told his friends he was attempting to access the roof of the residence hall and that he wants to ''end it all''. On interview in ED, pt was agitated, labile. On REAM, he is irritable, minimizing, minimally cooperative, unable to engage meaningfully with the interview.     Resume home medications:  -Gabapentin 300mg  TID  -Prozac 20mg  qd    Available PRNs:  -Olanzapine 5mg  q8h PRN agitation  -Olanzapine 10mg  IM PRN emergent agitation    5150 DTS Thank you for involving  [facility] in this case. Please page [phone] for further questions or concerns.     Note written by Elenore Rota, MD, resident psychiatrist.

## 2018-05-10 NOTE — ED Notes
Pt brought in by [facility], in [place], pt aggressive and not following verbal commands, pt placed in 4-point hard restraints, pt remains physically & verbally aggressive, pt does not want to give name or DOB. Per [facility], pt is intoxicated, 3-4 friends at scene was holding pt down, pt stated to friends he wants to just off the roof of the building they were at, pt ran up the stairs of the building to the roof but the roof doors were locked, +SI, -HI. History of Depression, has been seeing a Psych PCP.

## 2018-05-10 NOTE — ED Notes
MD at bedside. ED Resident.

## 2018-05-10 NOTE — ED Notes
Report received from Alinda Money, [place]. Pt settled, appears to sleep in between nursing interventions. Will continue with patient's plan of care.

## 2018-05-10 NOTE — ED Notes
Furniture conservator/restorer. Arrived by [facility]. Placed in 4 point restraints on gurney in ambulance bay by [facility] and security. Taken directly to room 2. Pt arrives argumentative, cursing at staff, declines to provide information to staff.

## 2018-05-10 NOTE — ED Notes
Turned off the clients to help decrease environment stimuli.

## 2018-05-10 NOTE — Consults
[facility] Consultation 05/10/2018   Patient Name: Alan Glover   Patient MRN: [id number]   Date of Birth: [birth year]   Patient Location: RR02/RR02   Date of Service: 05/10/2018     Requesting Physician:  Dr. Meda Coffee, Jonelle Sidle., MD,*    Identifying Data/Chief Complaint:  Alan Glover is a 17 y.o. male with past psychiatric history of GAD who was BIB [facility] on 5150 DTS.    CC: ''I don't want to talk to you!''    Collateral Contact Information:  Extended Emergency Contact Information  Primary Emergency Contact: Armando Reichert [place]  Mobile Phone: [phone]  Relation: Father  Preferred language: English  Interpreter needed? No    History of Present Illness:  Alan Glover is a 17 y.o. male with past psych hx GAD who was BIB [facility] on 5150 DTS for SI. Pt was placed in 4 point restraints upon arrival in ED due to severe agitation. Per police hold, patient told his friends he attempted to access the roof of the residence hall, his friends held him down, and pt said he wanted to ''end it all''. On interview, pt is cursing and yelling at multiple providers. When asked about precipitating events, pt states ''I don't want to talk to you.''  Pt refused to engage meaningfully in interview despite attempts by multiple providers. He states ''fuck you, get out!'' Interview ended abruptly due to patient agitation.    Past Psychiatric History:  Per H&P by Dr. Ezzard Flax 03/04/18:''  Hosp: None. Just one ED visit for panic attack.  Panic attacks: 2 big ones. Not afraid of getting a panic attack  SA: none  SIB: none  Therapy: Spring 2018: for the parents issues. 2 visits. He stopped because he didn't like the therapist.               CAPS Fall 2018. 3 sessions, maybe per year. He was feeling better,   Meds: Gabapentin 300mg  TID.  ???  DEVELOPMENTAL HISTORY (including prenatal history, drug/etoh exposure, complications during pregnancy, gestational age at birth, developmental milestones): No delays, no learning disabilities. Little sister died when he was [age]. He self-defecated til like age 6, and he didn't respond very well to social cues. AP classes in high school   ???  SOCIAL HISTORY (including family structure, occupational functioning, history of abuse, hobbies, interests, residence, and relationships):  Both parents, 1 brother, 1 sister. Mom has depression. Anxiety on both sides. Used to play piano, play sports, watch sports. Ran cross country. Still enjoys those things.   ???  ???  EDUCATIONAL HISTORY (including current grade, grade completed, type of classroom, academic or behavioral problems)  All As last quarter. He is in harder classes this quarter. Goal is nothing lower than a B. Looking into [employer] track.   ???  ???  LEGAL HISTORY (DUI's, arrests, convictions, incarcerations, on parole, gang activity, felony convictions, etc.):  None  ???  SUBSTANCE ABUSE HISTORY (including frequency, duration and associated psychiatric problems):  Nicotine: Last use, once last week. 4-5 puffs off the jule 2x/week  Mj: Smokes once a day. Stopped for 3 weeks. But he smokes with his friends for fun. Once a day.   Alcohol: once or twice a week. When drinking 7-8 drinks/week  Meth, heroin, none  Cocaine: last use 3-4 months ago. Used only 2x.  ???  FAMILY PSYCHIATRIC AND MEDICAL HISTORY (specify details and relatives involved):  Depression and anxiety in the family. ''    Past Medical History:  Past Medical History:   Diagnosis Date   ??? Anxiety    ??? Red-green color blindness        Allergies and Adverse Drug Reactions  No Known Allergies    Medication Reconciliation:  Current Outpatient Prescriptions:   ???  gabapentin 300 mg capsule, Take 1 capsule (300 mg total) by mouth three (3) times daily., Disp: 90 capsule, Rfl: 0  ???  traZODone 50 mg tablet, Take 1 tablet (50 mg total) by mouth at bedtime., Disp: 30 tablet, Rfl: 0    Review of Systems:  Unable to complete     Physical Examination: Vitals: Blood pressure 136/85, pulse 145, resp. rate 22, weight 160 lb (72.6 kg), SpO2 99 %.    Gen: in NAD, alert and oriented  HEENT: NCAT, PERRL, EOMI, MMM  Pulm: no increased WOB    Laboratory Data and Studies:  No results found for this or any previous visit (from the past 24 hour(s)).    No results found.     Mental Status Examination:   Appearance: fairly groomed, 17yo male wearing casual home clothes, in 4 pt restraints  Behavior: uncooperative, yelling, cursing   Motor: +psychomotor agitation  Speech: spontaneous, regular in rate, rhythm, volume and quantity.  Mood: irritable  Affect: labile  Thought process: superficially linear, logical and goal-directed.  Thought content: unable to assess  Concentration and attention: intact as evidenced by ability to appropriately answer questions throughout interview.  Intellect/Fund of knowledge: Average as evidenced by use of language, vocabulary and educational background.  Insight: Poor  Judgment: Poor    Assets and Strengths:    Participation in Treatment Planning in Past.    Suicide Risk Assessment:  Risk and protective factors:  1. Suicidal ideation:     Unable to or refuses to answer   2. Intent to act upon thoughts of suicide:     Unable to or refuses to answer   3. Firearms:     Unable to or refuses to answer   4. Access to other stated means and environmental factors:     Unable to or refuses to answer   5. Recent suicidal behaviors or preparatory acts:     Unable to or refuses to answer   6. Prior suicide attempts:     Unable to or refuses to answer   7. Self-directed violence without suicidal intent:     Unable to or refuses to answer   8. Psychiatric and family history:    See psychiatric history for details on diagnoses and hospitalizations   9. Other key symptoms and dynamic risk factors:     Current or recent substance intoxication: See substance use history or HPI   10. Protective factors: (Absences of risk factors are also considered proctective factors)  Engagement in mental health care     Risk formulation and interventions:  11. Baseline chronic risk   Compared to the general population, the patient's baseline chronic suicide risk is estimated to be:    Mildly elevated baseline risk   12. Acute risk  The patient's current, acute risk is estimated to be:    Mildly elevated above his baseline   13. Risk mitigation and interventions:    Plan to pursue a higher level of care    14. Additional considerations:    None   15. The most appropriate and least restrictive treatment recommendations at this time are:    Involuntary psychiatric hospitalization       Diagnostic Impression:  Mental  Health Diagnoses and Relevant Medical Conditions:  Unspecified mood d/o  H/o GAD    Significant Psychosocial and Contextual Factors:  Appears intoxicated    Assessment and Recommendations:  Alan Glover is a 17 y.o. male with a past psychiatric history of GAD who was BIB [facility] on 5150 DTS. Per police hold, patient told his friends he was attempting to access the roof of the residence hall and that he wants to ''end it all''. On interview in ED, pt is agitated, labile and refuses to engage meaningfully in interview with multiple providers. Pt met LPS criteria and police hold continued. Given these factors, it appears likely that the patient will benefit from inpatient hospitalization at this time. The patient is to be transferred to an appropriate facility in the area, as there are no beds available at our institution at this time.    Pt cannot be managed at a lower level of care at this time, and will require inpatient hospitalization for further stabilization and management. The plan is as follows:    1. Psychiatric:  Resume home medications:  -Gabapentin 300mg  TID  -Prozac 20mg  qd    Available PRNs:  -Olanzapine 5mg  q8h PRN agitation  -Olanzapine 10mg  IM PRN emergent agitation    2. Medical:Per ER 3. Legal:5150 DTS    4. Code status: Full code    5. Disposition:Board and transfer    Please page [phone] for further questions or concerns. Thank you for involving [facility] in this case.    Note written by Loyola Mast, MD, PGY-II. This case was discussed with Dr. Vinie Sill, attending psychiatrist, with whom the above assessment and plan were jointly formulated. Recommendations and plan were discussed with referring physician Dr. Meda Coffee.

## 2018-05-10 NOTE — ED Notes
Pt ambulated to restroom, security at bedside for standby.

## 2018-05-10 NOTE — ED Notes
Pt sleeping. 

## 2018-05-10 NOTE — ED Notes
Rounded on patient. Pt sleeping comfortably with lights dimmed. Pt. Directly observed by security and care partner. No observed acute distress at this time. Breakfast ordered for patient.

## 2018-05-10 NOTE — ED Notes
Rounded on patient. Pt willing to speak with staff and expressing understanding of why he is here but expressing that he never honestly intended on jumping or trying to hurt himself.

## 2018-05-10 NOTE — ED Notes
Patient transported to 4E with security and care partner via wheelchair.

## 2018-05-10 NOTE — ED Notes
Pt yelled out needing to urinate, offered to remove one restraint to allow to use urinal, pt refused and only wants to go to the restroom. Pt is informed due to being verbally and physically aggressive, has to remain in restraints. Pt states ''well hold the urinal to my penis so I can pee then''. Pt has his shorts on, attempted to unbutton shorts, pt says ''stop it I don't want you to help my pee'' I removed myself from pt and stopped attempting to help urinate in urinal. Pt states ''I will just pist on myself then''. Pt not following verbal commands, continues with verbal aggressive behavior and repetitive cursing while in restraints.

## 2018-07-31 ENCOUNTER — Emergency Department (HOSPITAL_COMMUNITY)
Admission: EM | Admit: 2018-07-31 | Discharge: 2018-07-31 | Disposition: A | Discharge status: Home / Self Care | Payer: 59 | Attending: Emergency Medicine | Admitting: Emergency Medicine

## 2018-07-31 ENCOUNTER — Emergency Department (HOSPITAL_COMMUNITY): Payer: 59

## 2018-07-31 ENCOUNTER — Other Ambulatory Visit: Payer: Self-pay

## 2018-07-31 ENCOUNTER — Encounter (HOSPITAL_COMMUNITY): Payer: Self-pay

## 2018-07-31 DIAGNOSIS — S0990XA Unspecified injury of head, initial encounter: Secondary | ICD-10-CM | POA: Diagnosis present

## 2018-07-31 DIAGNOSIS — M25572 Pain in left ankle and joints of left foot: Secondary | ICD-10-CM | POA: Diagnosis not present

## 2018-07-31 DIAGNOSIS — S0083XA Contusion of other part of head, initial encounter: Secondary | ICD-10-CM | POA: Diagnosis not present

## 2018-07-31 DIAGNOSIS — M25512 Pain in left shoulder: Secondary | ICD-10-CM | POA: Diagnosis not present

## 2018-07-31 DIAGNOSIS — S80211A Abrasion, right knee, initial encounter: Secondary | ICD-10-CM | POA: Insufficient documentation

## 2018-07-31 DIAGNOSIS — S50812A Abrasion of left forearm, initial encounter: Secondary | ICD-10-CM | POA: Diagnosis not present

## 2018-07-31 DIAGNOSIS — Y92008 Other place in unspecified non-institutional (private) residence as the place of occurrence of the external cause: Secondary | ICD-10-CM | POA: Insufficient documentation

## 2018-07-31 DIAGNOSIS — Y999 Unspecified external cause status: Secondary | ICD-10-CM | POA: Diagnosis not present

## 2018-07-31 DIAGNOSIS — Y939 Activity, unspecified: Secondary | ICD-10-CM | POA: Insufficient documentation

## 2018-07-31 DIAGNOSIS — W132XXA Fall from, out of or through roof, initial encounter: Secondary | ICD-10-CM | POA: Insufficient documentation

## 2018-07-31 DIAGNOSIS — W19XXXA Unspecified fall, initial encounter: Secondary | ICD-10-CM

## 2018-07-31 DIAGNOSIS — M79641 Pain in right hand: Secondary | ICD-10-CM | POA: Insufficient documentation

## 2018-07-31 DIAGNOSIS — T07XXXA Unspecified multiple injuries, initial encounter: Secondary | ICD-10-CM

## 2018-07-31 DIAGNOSIS — R52 Pain, unspecified: Secondary | ICD-10-CM

## 2018-07-31 NOTE — ED Provider Notes (Signed)
[facility]-EMERGENCY DEPT Provider Note   CSN: [id number] Arrival date & time: 07/31/18  [id number]     History   Chief Complaint Chief Complaint  Patient presents with  . Fall    HPI Kevin Henderson is a 17 y.o. male.  HPI   Patient presents for evaluation of injuries from fall.  Patient reportedly fell about 1 AM this morning.  His mother is able to confirm this timing.  Reportedly the patient came home after drinking alcohol, went into the attic of his home, then climbed on the roof through a window.  He states he remembers being on the roof, then remembers, "waking up on the grass."  He thinks that he fell off the roof onto his face.  Patient's mother states that she and his father heard a noise in the attic, around 1 AM, went into the attic, but nothing was there.  A few minutes later at 1:15 AM, they heard front door open, then heard the patient's bedroom door open, and communicated with him through the close door.  He stated he was going to bed and they did not investigate further.  Later this morning around 6 AM the patient came into his mother's room and told him he was injured and requested to come to the emergency department.  After that he took some Advil and drink some water, then subsequently came to the ED.  Patient complains of pain in his left face, head, neck, right thumb, left shoulder and left ankle.  There has been no behavioral changes.  There is been no vomiting.  The patient denies chest pain, shortness of breath, abdominal or back pain.  He is ambulatory, without significant pain.  There are no other known modifying factors.  History reviewed. No pertinent past medical history.  Patient Active Problem List   Diagnosis Date Noted  . Foreign travel 02/12/2013    History reviewed. No pertinent surgical history.      Home Medications    Prior to Admission medications   Medication Sig Start Date End Date Taking? Authorizing Provider    albuterol (PROVENTIL HFA;VENTOLIN HFA) 108 (90 Base) MCG/ACT inhaler Inhale 2 puffs into the lungs every 6 (six) hours as needed for wheezing or shortness of breath. 01/19/17   Helane RimaWallace, Erica, DO    Family History No family history on file.  Social History Social History   Tobacco Use  . Smoking status: Never Smoker  . Smokeless tobacco: Never Used  Substance Use Topics  . Alcohol use: Yes    Alcohol/week: 3.0 oz    Types: 5 Cans of beer per week    Comment: 3x a week,   . Drug use: Not Currently    Types: Marijuana    Comment: Have not recently in 3 months     Allergies   Patient has no known allergies.   Review of Systems Review of Systems  All other systems reviewed and are negative.    Physical Exam Updated Vital Signs Ht 6\' 2"  (1.88 m)   Wt 72.6 kg (160 lb)   BMI 20.54 kg/m   Physical Exam  Constitutional: He is oriented to person, place, and time. He appears well-developed and well-nourished. He appears distressed (Uncomfortable).  HENT:  Head: Normocephalic.  Right Ear: External ear normal.  Left Ear: External ear normal.  Contusion with swelling left zygoma.  No facial crepitation or instability.  Normal TMJ motion.  No palpable scalp or cranial defect.  Eyes: Pupils are equal,  round, and reactive to light. Conjunctivae and EOM are normal.  Neck: Normal range of motion and phonation normal. Neck supple.  Cardiovascular: Normal rate, regular rhythm and normal heart sounds.  Pulmonary/Chest: Effort normal and breath sounds normal. He exhibits no bony tenderness.  Abdominal: Soft. There is no tenderness.  Musculoskeletal: Normal range of motion.  Mild tenderness left shoulder with normal range of motion.  Tender right first MCP joint, right hand.  Normal range of motion right hand and fingers.  Mild tenderness left ankle without deformity or swelling.  Normal range of motion arms and legs bilaterally.  Neurological: He is alert and oriented to person,  place, and time. No cranial nerve deficit or sensory deficit. He exhibits normal muscle tone. Coordination normal.  Skin: Skin is warm, dry and intact.  Superficial abrasion left cheek.  Superficial abrasions right anterior knee without deep laceration requiring suture closure.  Minor abrasion left forearm, multiple linear, not bleeding or draining.  Psychiatric: He has a normal mood and affect. His behavior is normal. Judgment and thought content normal.  Nursing note and vitals reviewed.    ED Treatments / Results  Labs (all labs ordered are listed, but only abnormal results are displayed) Labs Reviewed - No data to display  EKG None  Radiology No results found.  Procedures Procedures (including critical care time)  Medications Ordered in ED Medications - No data to display   Initial Impression / Assessment and Plan / ED Course  I have reviewed the triage vital signs and the nursing notes.  Pertinent labs & imaging results that were available during my care of the patient were reviewed by me and considered in my medical decision making (see chart for details).  Clinical Course as of Aug 01 851  Wed Jul 31, 2018  9147 Patient with multiple contusions and wounds, without apparent serious injury.  Tetanus status is current, by report of mother.  Patient requires imaging to evaluate for occult injury.   [EW]    Clinical Course User Index [EW] Mancel Bale, MD     Patient Vitals for the past 24 hrs:  BP Temp Temp src Pulse Resp SpO2 Height Weight  07/31/18 1107 127/88 - - 86 17 100 % - -  07/31/18 0930 112/68 - - 73 14 99 % - -  07/31/18 0900 (!) 100/54 - - 87 16 99 % - -  07/31/18 0834 (!) 111/99 98.1 F (36.7 C) Oral 83 17 99 % - -  07/31/18 0818 - - - - - - 6\' 2"  (1.88 m) 72.6 kg (160 lb)    11:14 AM Reevaluation with update and discussion. After initial assessment and treatment, an updated evaluation reveals no change in clinical status.  He remains alert.   Findings discussed.  Patient was confronted on alcohol abuse, and became angry.  He was verbally abusive.  He stated that he did not need any additional help because he did not ask for that.  He was recommended to use the counseling and alcohol follow-up treatment to improve his condition.  His mother was in the room at the time, she was trying to contribute to this conversation, but he shut her off and then she began to cry.  All questions answered. Mancel Bale   Medical Decision Making: Fall with multiple contusions and abrasions.  Apparent alcohol abuse with alcohol intoxication contributing to falling.  Patient is engaging in highly risky behavior, and is at risk for serious injury and mortality.  He is  currently living with his family members, mother is in the room.  CRITICAL CARE-no Performed by: Mancel Bale   Nursing Notes Reviewed/ Care Coordinated Applicable Imaging Reviewed Interpretation of Laboratory Data incorporated into ED treatment  The patient appears reasonably screened and/or stabilized for discharge and I doubt any other medical condition or other [facility] requiring further screening, evaluation, or treatment in the ED at this time prior to discharge.  Plan: Home Medications-OTC analgesia as needed; Home Treatments-cryotherapy, wound therapy, alcohol avoidance; return here if the recommended treatment, does not improve the symptoms; Recommended follow up-counseling and alcohol follow-up as an outpatient    Final Clinical Impressions(s) / ED Diagnoses   Final diagnoses:  None    ED Discharge Orders    None       Mancel Bale, MD 07/31/18 1120

## 2018-07-31 NOTE — ED Triage Notes (Signed)
Pt arrived via POV with family. Pt is AOx4 and Ambulatory. Pt went to party last night drank, came home and decided to climb roof for unknown reason around 0115. Pt then fell off roof shortly after, time is unknown. Pt went to bed and then woke up this morning around 0600 and noticed he was covered in blood and abrasions. Pt fell off roof possibly.

## 2018-07-31 NOTE — Discharge Instructions (Addendum)
You are very lucky.  You did not have a serious injury from falling off the roof.  It is not safe to drink heavily, and engage in risky behaviors.  Please seek help for alcohol abuse and counseling for help with stressors and anger.  We have attached a resource guide to help you get some help.

## 2018-07-31 NOTE — ED Notes (Signed)
Bed: [id number] Expected date:  Expected time:  Means of arrival:  Comments: Ron ParkerCameron Raybourn; fall last night

## 2018-07-31 NOTE — ED Notes (Signed)
ED Provider was talking to patient about ETOH use and abuse. Pt became very disrespectful with ED Provider when mother began speaking and ED Provider was responding to mother. Pt told ED Provider to "shut the fuck up, I dont want you in my room anymore". ED Provider left seconds later.

## 2018-07-31 NOTE — ED Notes (Signed)
Patient transported to CT 

## 2018-07-31 NOTE — ED Notes (Signed)
ED Provider at bedside. 

## 2018-08-21 DIAGNOSIS — F3289 Other specified depressive episodes: Secondary | ICD-10-CM

## 2019-01-27 ENCOUNTER — Telehealth: Payer: Self-pay | Admitting: Physician Assistant

## 2019-01-27 ENCOUNTER — Other Ambulatory Visit: Payer: Self-pay | Admitting: Physician Assistant

## 2019-01-27 MED ORDER — OSELTAMIVIR PHOSPHATE 75 MG PO CAPS: 75.0000 mg | ORAL_CAPSULE | Freq: Two times a day (BID) | ORAL | 0 refills | Status: DC

## 2019-01-27 NOTE — Telephone Encounter (Signed)
Erroneous encounter-disregard

## 2019-01-27 NOTE — Telephone Encounter (Deleted)
Patient was in office today at her husband's appointment. He tested + for the flu. She has had symptoms starting early this morning. Will give Tamiflu due to exposure and start of symptoms.

## 2019-02-14 ENCOUNTER — Ambulatory Visit: Payer: PRIVATE HEALTH INSURANCE

## 2019-02-14 DIAGNOSIS — S99922A Unspecified injury of left foot, initial encounter: Secondary | ICD-10-CM

## 2019-02-15 ENCOUNTER — Inpatient Hospital Stay: Payer: BLUE CROSS/BLUE SHIELD

## 2019-02-15 NOTE — Progress Notes
PATIENT: Alan Glover  MRN: [id number]  DOB: [birth year]  DATE OF SERVICE: 02/14/2019    Subjective:   History was provided by patient.    Thad Osoria is a 18 y.o. male who presents for L foot pain.    Pt was dancing at a party last night. He was jumping when he landed on someone's foot and twisted his L foot. He heard some cracks. He was immediately able to bear weight with pain and walked/limped home. At home he iced and took advil which offered some pain relief. Placing weight on the L foot worsens the pain. Located more over lateral distal foot than ankle. No associated knee pain.     Objective:     Vitals: BP 102/59  ~ Pulse 75  ~ Temp 36.5 ???C (Tympanic)  ~ Ht 1.855 m (6' 1.03'')  ~ Wt 74.6 kg (164 lb 7.4 oz)  ~ BMI 21.68 kg/m???    Facility age limit for growth percentiles is 20 years.    Physical Exam  Gait: antalgic gait, avoiding weight on L foot  L Foot (injured) - nearly full active ROM without pain. Fully passive ROM with pain on extreme passive inversion. TTP over base of 5th metatarsal. NTTP over lateral and medial maleoli, NTTP over navicular.   R Foot (noninjuried) - full painless active and passive ROM. NTTP over any bony prominences.    Labs/Studies:   L Foot X-Ray 02/14/2019   IMPRESSION:  No radiographic evidence of acute displaced fracture. Mild soft tissue swelling adjacent to the lateral aspect of the foot. If concern for stress injury, MRI is the study of choice.       Assessment/Plan:   This is a 18 y.o. male with L foot injury and TTP over 5th metatarsal. L Foot XR shows no acute fracture.     Plan:  - RICE therapy  - Rigid foot boot for pain relief  - Ibuprofen/tylenol for pain as needed  - ortho/sports medicine referral for f/u XR    The above plan of care, diagnosis, order, and follow-up were discussed with the family. Questions related to this recommended plan of care were answered.    Follow Up Visit: if pain worsens Patient seen, examined and evaluated by the attending physician, Dr. Dareen Piano.    Particia Jasper [place] MS3    Author: Yevonne Pax [facility] 02/14/2019 4:43 PM       This note was authored by medical student, Tonny Bollman, at [employer]. This note should not be consulted for clinical, billing, or medico-legal purposes unless a physician has reviewed this note and added an attestation below regarding the accuracy of above documentation.

## 2019-02-28 ENCOUNTER — Ambulatory Visit: Payer: PRIVATE HEALTH INSURANCE

## 2019-02-28 DIAGNOSIS — M79672 Pain in left foot: Secondary | ICD-10-CM

## 2019-02-28 MED ORDER — ESCITALOPRAM OXALATE 10 MG PO TABS
10 mg | ORAL_TABLET | Freq: Every day | ORAL | 3 refills | Status: AC
Start: 2019-02-28 — End: ?

## 2019-02-28 NOTE — Progress Notes
PATIENT: Alan Glover  MRN: [id number]  DOB: 10/06/2001   DATE OF SERVICE: 02/28/2019       Subjective:      History was provided by the patient.    Alan Glover is a 18 y.o. male with a history of GAD, suicidal ideation, inpatient psychiatric admission, binge drinking, and marijuana use, presenting for follow up for L foot injury and anxiety.    The following portions of the patient's history were reviewed and updated as appropriate: problem list, allergies, current medications, past family history, past medical history, past social history, past surgical history.    Current Issues:   Current concerns:    #L foot injury  Pt last seen in clinic on 02/14/19 for L foot injury sustained when jumping and landing on someone's foot. The point of impact was the distal portion of the lateral L foot. He was weight bearing at the time, X-rays were negative for fracture, and was given a walking boot to use. The boot and ankle brace helped with walking, he used the boot for a few days and then just used the wrap. His L ankle pain resolved but still has pain in the lateral L foot near the base of the 5th metatarsal. He initially used ice and Advil. Has not tried to run or play basketball on it but now he can walk without pain.However, he says he is favoring the L side and will lean more to the R side, and limps a litle still when walking. He also reports a little soreness in both knees due to putting more stress on mthe knees with limping.    #GAD  Pt has a history of inpatient 5150 psychiatric admission for SI in 04/2018 likely due to an alcohol induced mood disorder. Pt previously was taking fluoxetine 20 mg daily but just stopped taking it in 06/2019 because he didn't feel like it wasn't making a difference. He has been doing really well since then. He has had no panic attacks within last year. He says anxiety is a lot better but still has a baseline low level of anxiety at all times. Has been doing very well in school, getting As and Bs. He is also part of a [employer], has a close knit group of friends and roommates. Family has also been supportive. He mostly worried about social events and about studying for exams, but otherwise not worrying about other things. He is doing meditation and journaling to help manage his anxiety. He is interested in medication to help maintain his progress.   Mood in past year is fine except for feeling a little depressed in the fall for a couple weeks, which has since resolved.    Went to [facility] starting in beginning of 2018 for psychiatric care and therapy. He was also participating in a research study at [facility] on the link between cannabis and anxiety for 10 weeks from 04-05/2018. He then started going to [place] in [place] for counseling and CBT, and he really liked it there. Then, he stopped going at beginning of this past quarter 12/2018 because he got [place] [facility] and wanted to be seen longitudinally at [facility]. He hasn't been able to schedule an appointment at [facility] this quarter, on wait list.    He denies SI/HI since that admission in 04/2018. He loves to play sports and poker with his friends. 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 Has smoked marijuana since 15, he  stopped for 3 months after his hospitalization, and has resumed. Smokes marijuana 2-3x a week socially, does not smoke alone, using a bong. Drinks about 12 drinks a week, divided by 2x. Sexually active, women, 1 partner, uses condoms 100% of the time. Not interested in STD screening.       Objective:      Growth parameters are noted and are appropriate for age.  BP 129/70  ~ Pulse 96  ~ Temp 36.6 ???C (Oral)  ~ Resp 18  ~ Ht 1.865 m (6' 1.43'')  ~ Wt 73.8 kg (162 lb 9.6 oz)  ~ SpO2 96%  ~ BMI 21.20 kg/m???    Facility age limit for growth percentiles is 20 years.  Facility age limit for growth percentiles is 20 years. Facility age limit for growth percentiles is 20 years.     General:   alert and no distress   Gait:   Antalgic gait favoring the L side   Skin:   normal   Oral cavity:   lips, mucosa, and tongue normal; teeth and gums normal   Eyes:   sclerae white, pupils equal and reactive, red reflex normal bilaterally   Ears:   normal bilateral canals and TMs   Neck:   supple, no adenopathy and no masses   Lungs:  clear to auscultation bilaterally   Heart:   regular rate and rhythm, S1, S2 normal, no murmur, click, rub or gallop   Abdomen:  soft, non-tender; bowel sounds normal; no masses,  no organomegaly   GU:  exam deferred   Extremities:   No swelling of the L ankle or foot. Some tenderness to palpation on lateral aspect of L foot near base of the 5th metatarsal and midfoot, no point tenderness, 5/5 strength to plantar and dorsiflexion, full ROM   Neuro:  normal without focal findings and reflexes normal and symmetric    Psych: GAD7 = 5, PHQ2 = 0, denies SI/HI/AVH  Assessment:     Alan Glover is a 18 y.o. male .  with a history of GAD, suicidal ideation, inpatient psychiatric admission, binge drinking, and marijuana use, presenting for follow up for L foot injury and anxiety.    Plan:        #L foot injury: Improving over the last 2 weeks on conservative therapy and a L foot boot/brace. There is low suspicion for a fracture, and previous X-rays were negative. Likely in process of recovering. Pt still has a limping gait with some compensation evidenced by subjective soreness in bilateral knees.  -Orthopedic/sports medicine consult ordered  -PT consult ordered  -No imaging at this time  -Conservative therapy: Continue using the boot, ice and NSAIDS as needed for pain, ROM exercises    #GAD: GAD7 = 5, mild anxiety. He is doing very well inside and outside of school over the past quarter. He endorses having continued baseline anxiety but that he is staying on top of it by focusing on his own mental health and healthy coping strategies. No evidence of major depression at this time.  -Start Lexapro 10 mg daily  -Continue with CBT and therapy, pt will make appointment with [facility]  -Follow up in 1 month with [place]    #Marijuana use  #Binge drinking: Pt has 12 drinks a week over the weekend, 6 each day, down from 24 a week last year. He also smokes marijuana 2-3x a week socially. He has been trying to cut down on alcohol intake, says that they are  not used to cope with his anxiety.  -Pt counseled on cutting down alcohol and marijuana use    Follow up visit in 1 month or sooner as needed.      Starr Lake 02/28/2019 1:02 PM     This note was authored by medical student, Starr Lake, at [facility]. This note should not be consulted for clinical, billing, or medico-legal purposes unless a physician has reviewed this note and added an attestation below regarding the accuracy of above documentation.

## 2019-03-01 DIAGNOSIS — F419 Anxiety disorder, unspecified: Secondary | ICD-10-CM

## 2019-03-27 ENCOUNTER — Telehealth: Payer: Self-pay | Admitting: Family Medicine

## 2019-03-27 NOTE — Telephone Encounter (Signed)
Called pt on number provided to schedule f.u, pts father answered and stated pt lives in [place].

## 2019-05-05 ENCOUNTER — Other Ambulatory Visit: Payer: Self-pay | Admitting: Podiatry

## 2019-05-05 ENCOUNTER — Other Ambulatory Visit: Payer: Self-pay

## 2019-05-05 ENCOUNTER — Ambulatory Visit: Payer: BLUE CROSS/BLUE SHIELD | Admitting: Podiatry

## 2019-05-05 ENCOUNTER — Ambulatory Visit (INDEPENDENT_AMBULATORY_CARE_PROVIDER_SITE_OTHER): Payer: 59

## 2019-05-05 ENCOUNTER — Encounter: Payer: Self-pay | Admitting: Podiatry

## 2019-05-05 VITALS — BP 90/55 | HR 78 | Resp 16

## 2019-05-05 DIAGNOSIS — M779 Enthesopathy, unspecified: Secondary | ICD-10-CM

## 2019-05-05 DIAGNOSIS — M79672 Pain in left foot: Secondary | ICD-10-CM

## 2019-05-05 DIAGNOSIS — M21622 Bunionette of left foot: Secondary | ICD-10-CM

## 2019-05-05 MED ORDER — TRIAMCINOLONE ACETONIDE 10 MG/ML IJ SUSP
10.0000 mg | Freq: Once | INTRAMUSCULAR | Status: AC
  Administered 2019-05-05: 10 mg

## 2019-05-05 NOTE — Progress Notes (Signed)
   Subjective:    Patient ID: Kevin Henderson, male    DOB: [birth year], 18 y.o.   MRN: [id number]  HPI    Review of Systems  All other systems reviewed and are negative.      Objective:   Physical Exam        Assessment & Plan:

## 2019-05-05 NOTE — Progress Notes (Signed)
Subjective:   Patient ID: Kevin Henderson, male   DOB: 18 y.o.   MRN: [id number]   HPI Patient presents stating that he sprained his ankle dancing several months ago and developed a lot of pain on the outside of his left foot and it is been that way since the injury.  Patient does go to college and is quite active and states that it does make it hard for him to wear shoe gear at times and patient does not smoke and likes to be active   Review of Systems  All other systems reviewed and are negative.       Objective:  Physical Exam Vitals signs and nursing note reviewed.  Constitutional:      Appearance: He is well-developed.  Pulmonary:     Effort: Pulmonary effort is normal.  Musculoskeletal: Normal range of motion.  Skin:    General: Skin is warm.  Neurological:     Mental Status: He is alert.     Neurovascular status found to be intact muscle strength is adequate range of motion within normal limits.  Patient is found to have inflammation fluid buildup around the fifth metatarsal head left with prominence of the bone structure also noted left over right.  Patient is found to have good digital perfusion well oriented x3     Assessment:  Possibility for tailor's bunion deformity left with inflammation fluid around the joint surface that was aggravated by previous injury     Plan:  H&P conditions reviewed and I recommended conservative approach and I did sterile prep of the area and then injected carefully the fifth MPJ 3 mg Dexasone Kenalog 5 mg Xylocaine advised on wider shoes and soaks and reappoint to recheck and discuss possibility for [facility] bunion surgery with osteotomy and fixation  X-rays indicate that there is moderate deformity the fifth metatarsal head left in a lateral direction

## 2019-05-26 ENCOUNTER — Ambulatory Visit (INDEPENDENT_AMBULATORY_CARE_PROVIDER_SITE_OTHER): Payer: BLUE CROSS/BLUE SHIELD | Admitting: Podiatry

## 2019-05-26 ENCOUNTER — Encounter: Payer: Self-pay | Admitting: Podiatry

## 2019-05-26 ENCOUNTER — Other Ambulatory Visit: Payer: Self-pay

## 2019-05-26 DIAGNOSIS — M779 Enthesopathy, unspecified: Secondary | ICD-10-CM

## 2019-05-26 DIAGNOSIS — M21622 Bunionette of left foot: Secondary | ICD-10-CM | POA: Diagnosis not present

## 2019-05-26 NOTE — Progress Notes (Signed)
Subjective:   Patient ID: Kevin Henderson, male   DOB: 18 y.o.   MRN: [id number]   HPI Patient states that he is feeling quite a bit better but I still have mild discomfort and I know somebody I may end up needing surgery   ROS      Objective:  Physical Exam  Neurovascular status intact with patient still found to have mild prerogative I rub the fifth metatarsal head left over with mild inflammation still noted     Assessment:  Inflammatory capsulitis fifth MPJ that responded well to injection but still present left with tailor's bunion deformity     Plan:  H&P education rendered concerning condition.  I do think that that ultimately this may require surgical intervention but at this point will get a hold off and just watch this and see how it does.  Patient will be seen back for [place] to recheck

## 2019-07-14 IMAGING — CR DG FINGER THUMB 2+V*R*
3 series · 3 of 3 positions shown · non-contrast
Comparison: None.

CLINICAL DATA: Right thumb pain after fall off roof last night.

EXAM:
RIGHT THUMB 2+V

[x finger pa right]
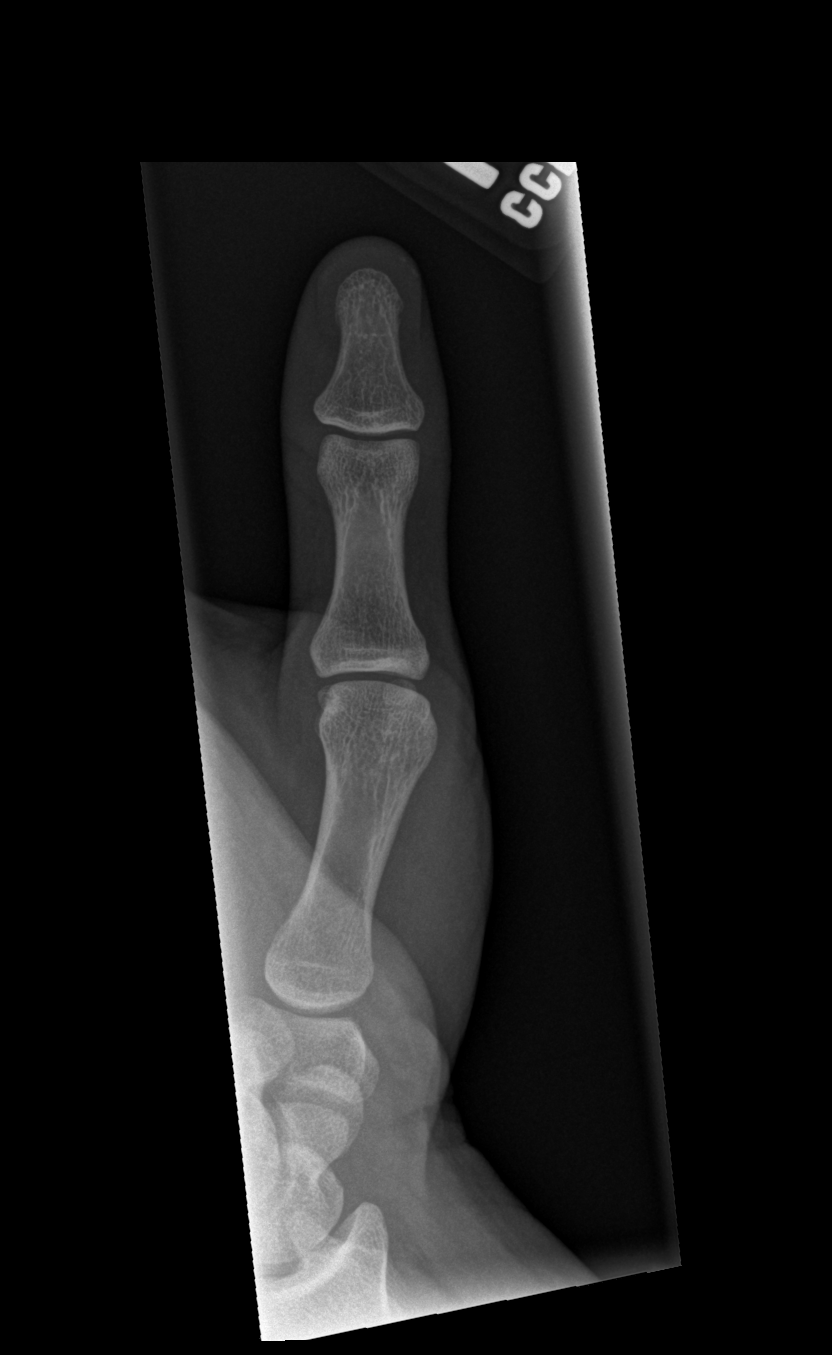

[x finger obl right]
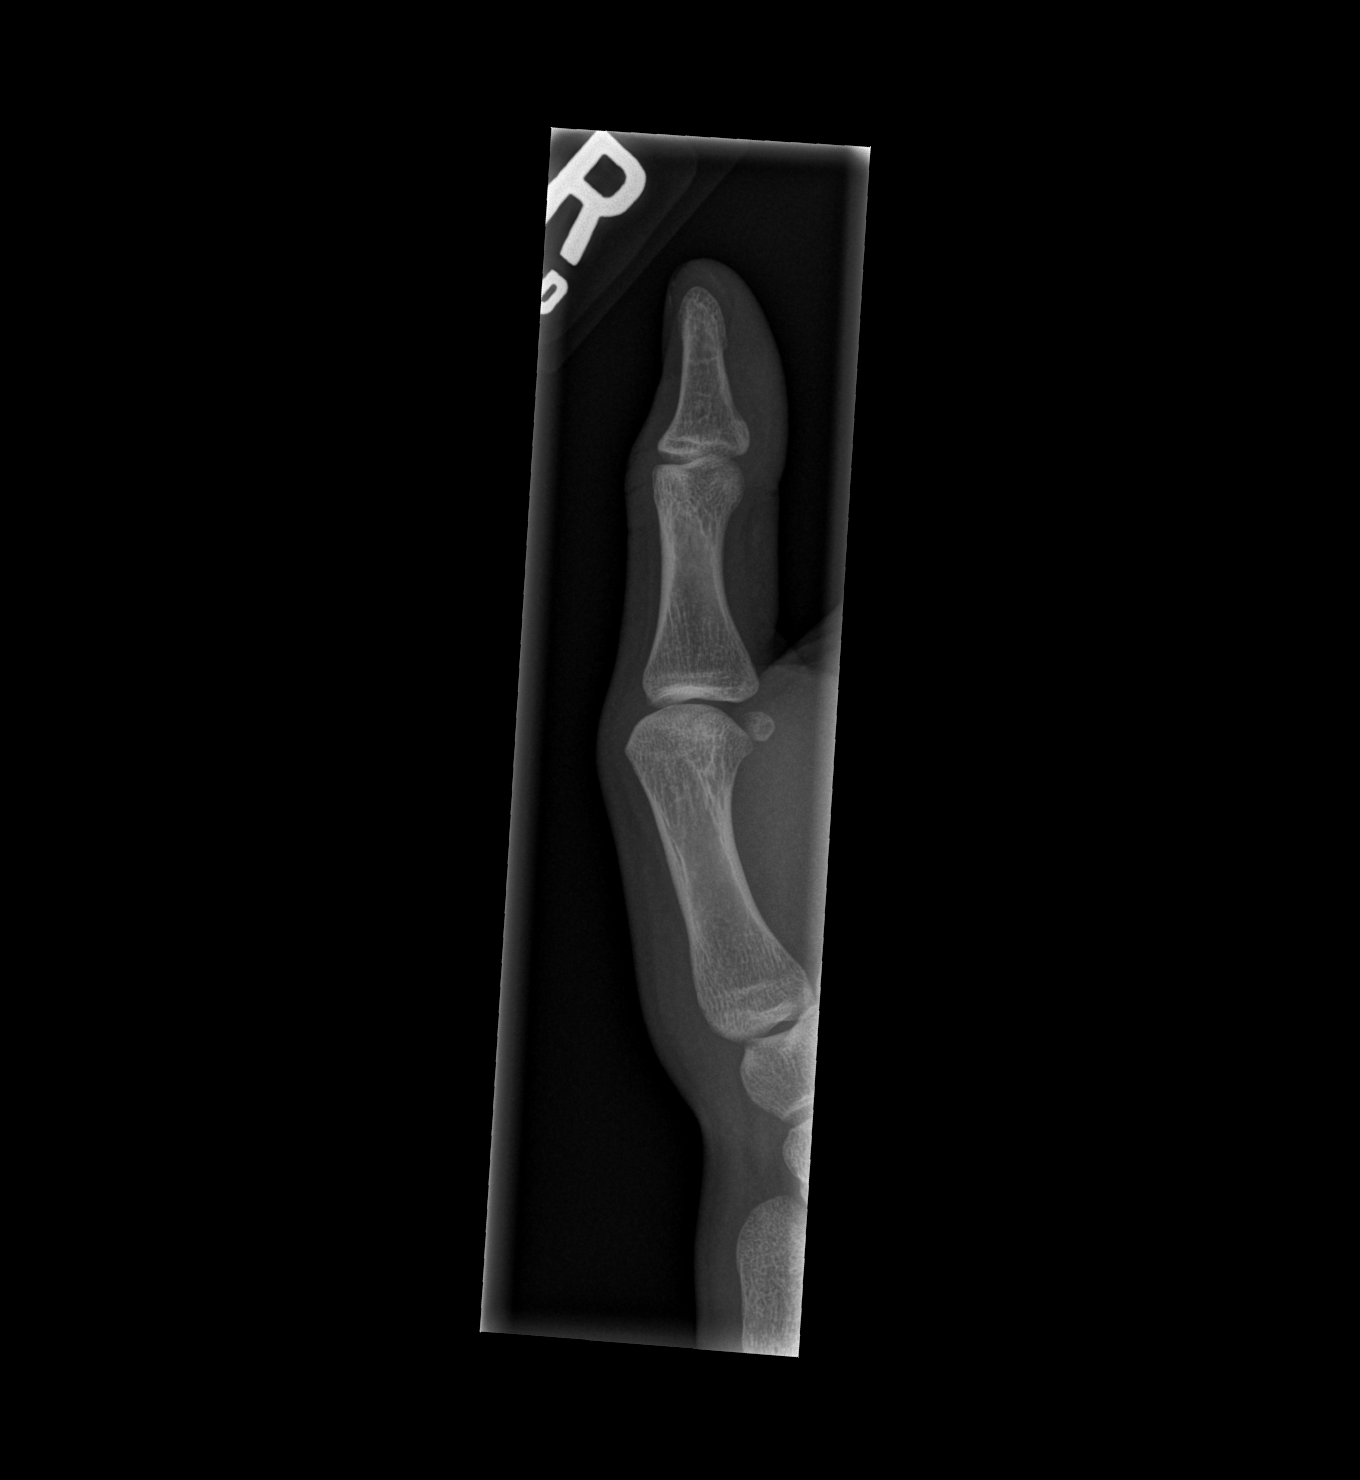

[x finger lat right]
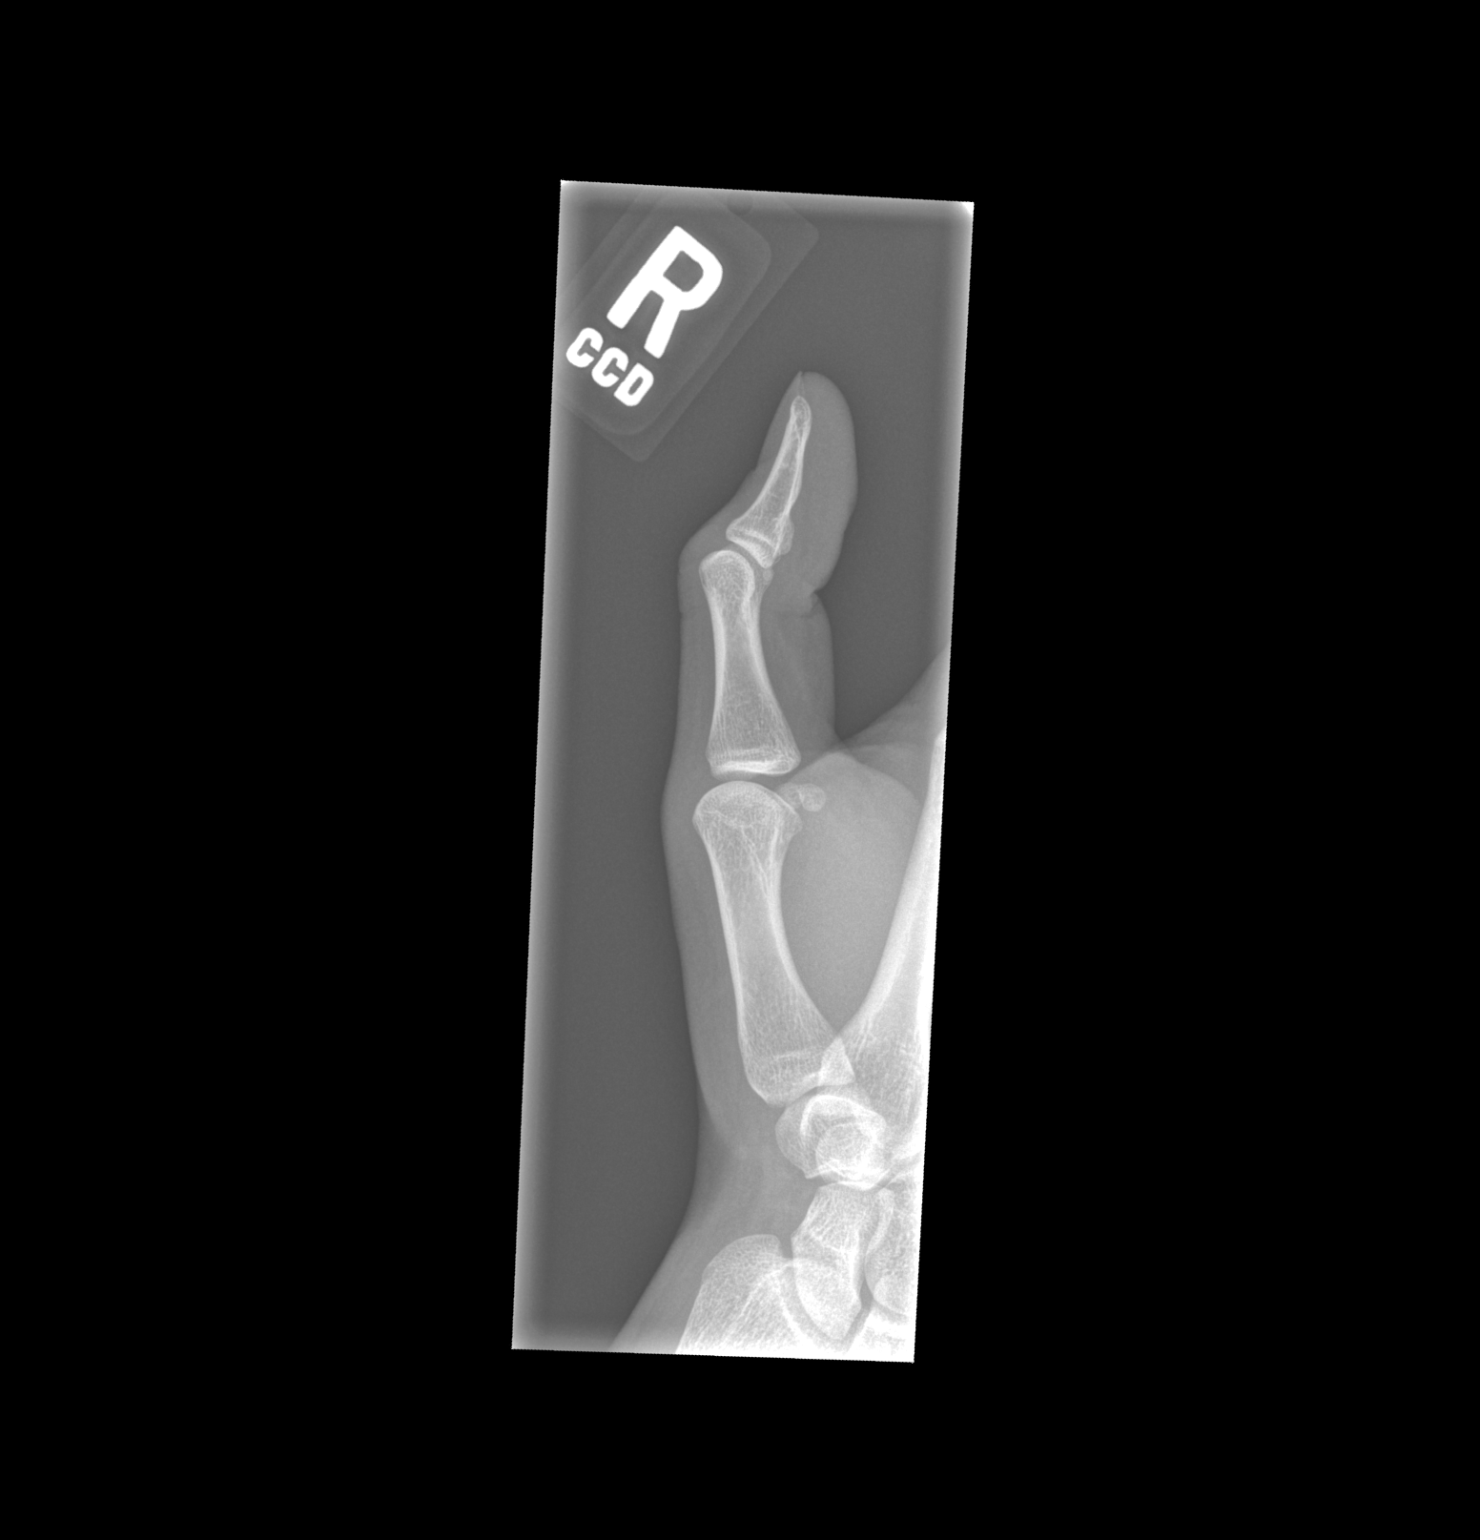

[3 of 3 positions shown; findings below may reference images not displayed]

FINDINGS: There is no evidence of fracture or dislocation. There is no
evidence of arthropathy or other focal bone abnormality. Soft
tissues are unremarkable
IMPRESSION: Normal right thumb.

## 2019-07-14 IMAGING — CT CT MAXILLOFACIAL W/O CM
4 of 10 series · 15 of 47 positions shown, 18 images · non-contrast
Comparison: None.

CLINICAL DATA: Fall last night with lacerations and swelling to the
left cheek. Sore neck. Possible fall from roof. Initial encounter.

EXAM:
CT HEAD WITHOUT CONTRAST
CT MAXILLOFACIAL WITHOUT CONTRAST
CT CERVICAL SPINE WITHOUT CONTRAST
TECHNIQUE: Multidetector CT imaging of the head, cervical spine, and
maxillofacial structures were performed using the standard protocol
without intravenous contrast. Multiplanar CT image reconstructions
of the cervical spine and maxillofacial structures were also
generated.

[Series 4: coronal soft tissue · coronal · 0.34mm/px · 3 of 67 slices shown]
[im 17/67  bone]
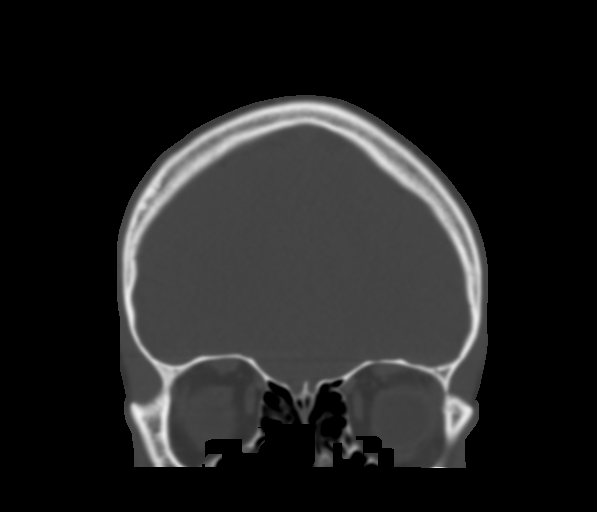
[im 34/67  bone]
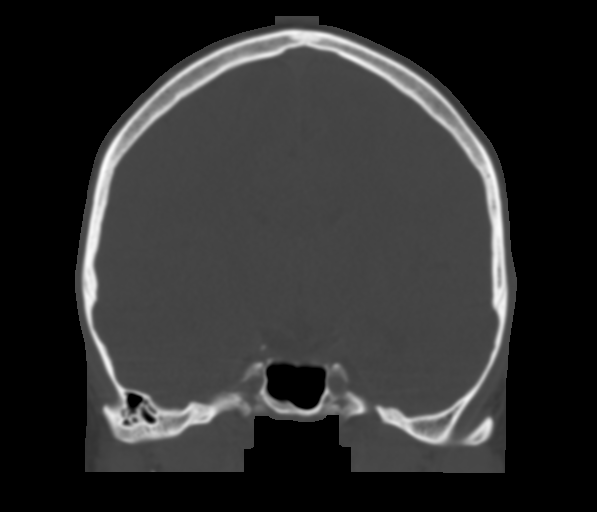
[im 50/67  bone]
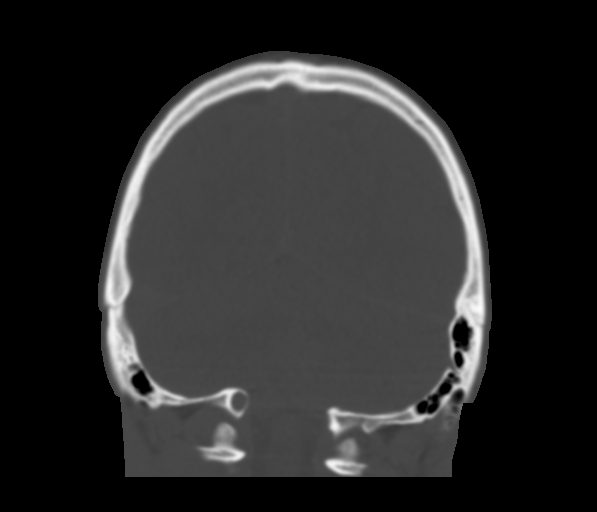

[Series 8: c spine soft · axial · 0.33mm/px · z∈[-306,-284]mm · 2 of 91 slices shown]
[im 12/91  brain]
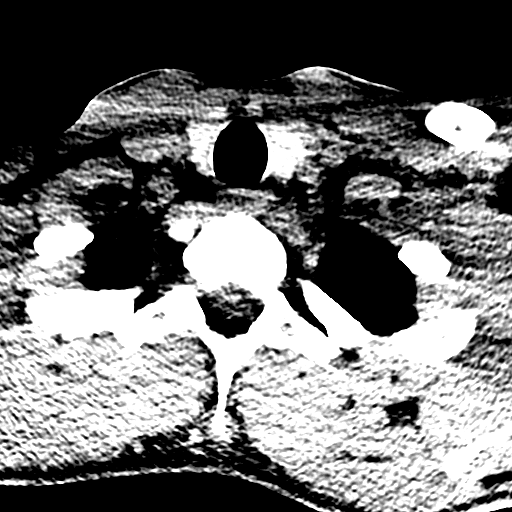
[im 23/91  brain]
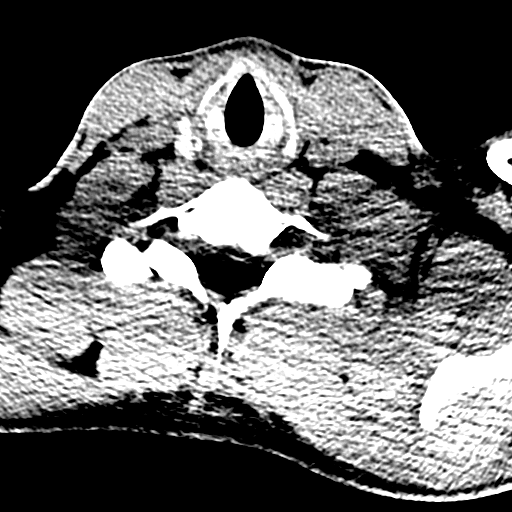

[Series 9: orthogonal axials · axial · 0.23mm/px · z∈[-345,-188]mm · 9 of 107 slices shown, 12 images]
[im 11/107  brain]
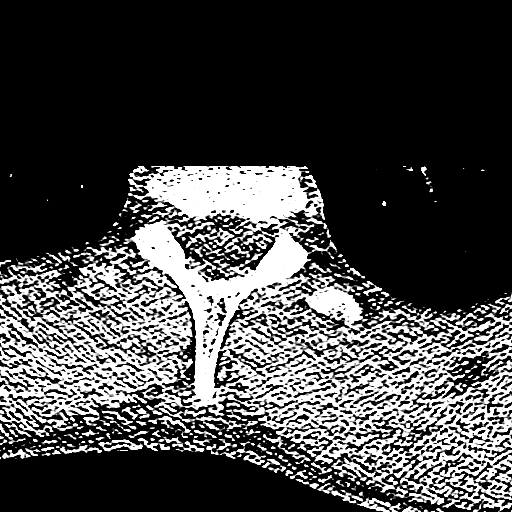
[im 11/107  bone]
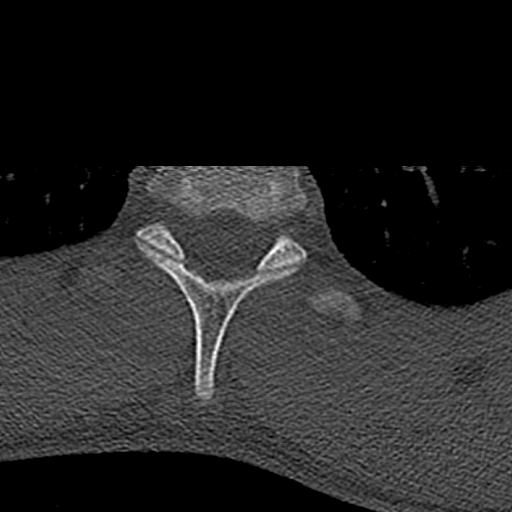
[im 22/107  bone]
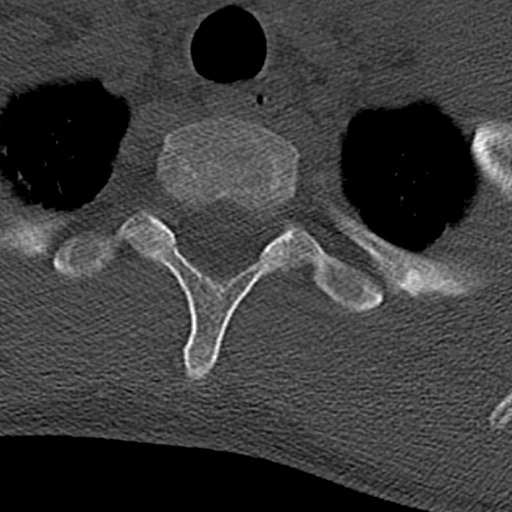
[im 32/107  bone]
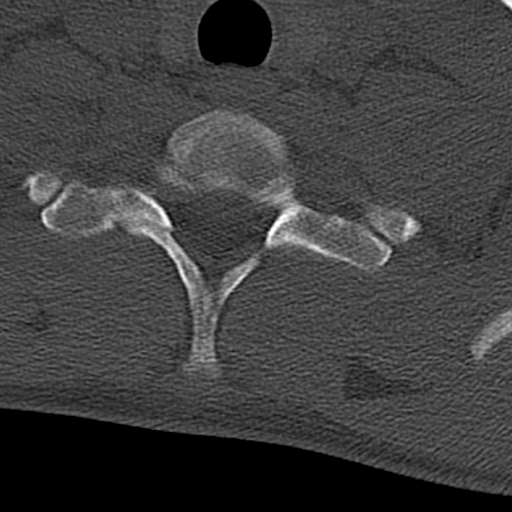
[im 43/107  bone]
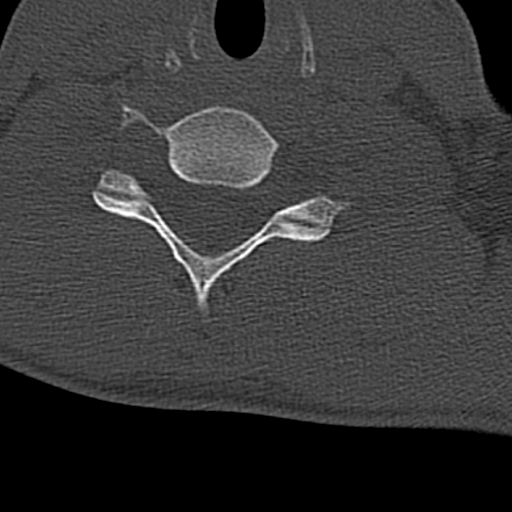
[im 54/107  brain]
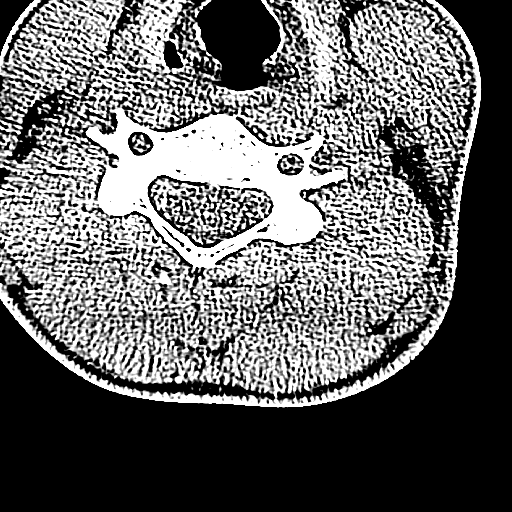
[im 54/107  bone]
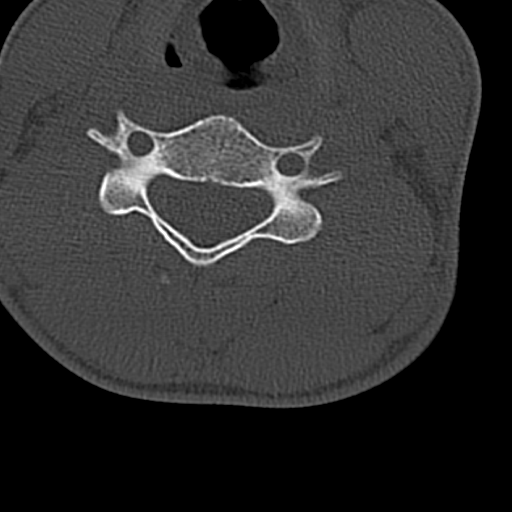
[im 64/107  bone]
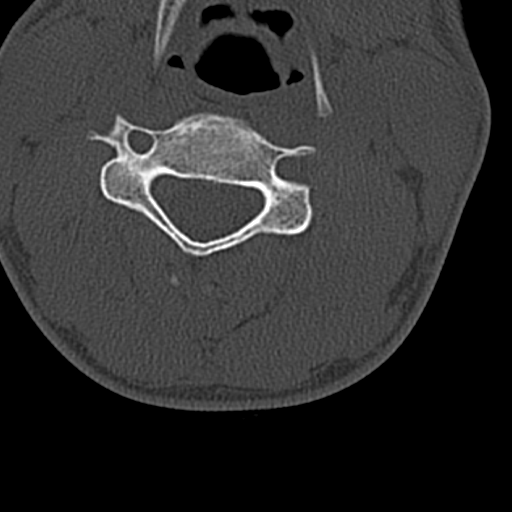
[im 75/107  bone]
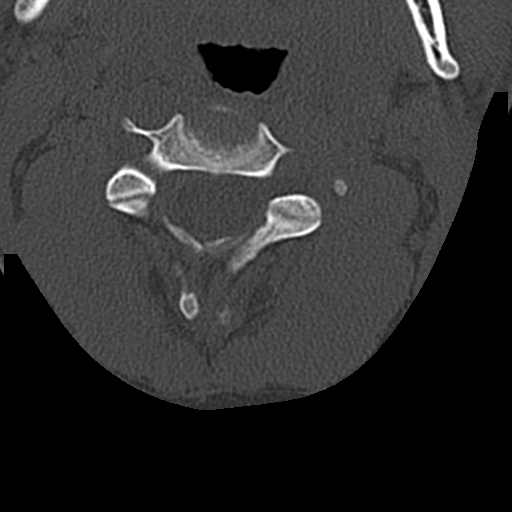
[im 85/107  bone]
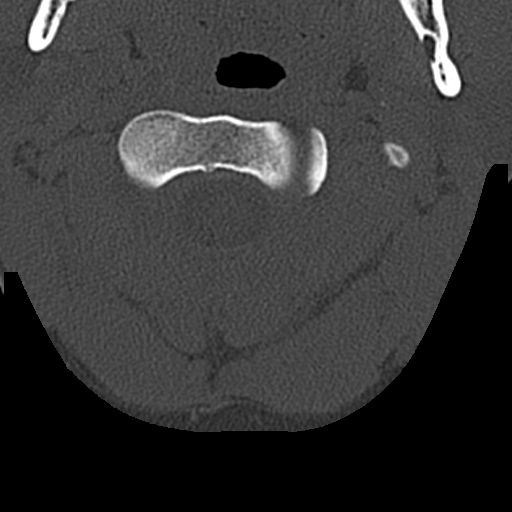
[im 96/107  brain]
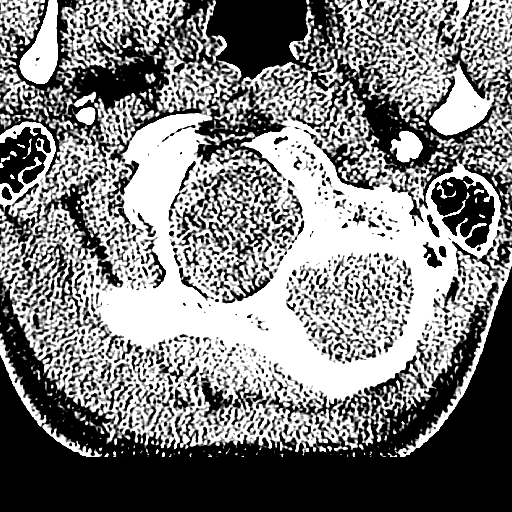
[im 96/107  bone]
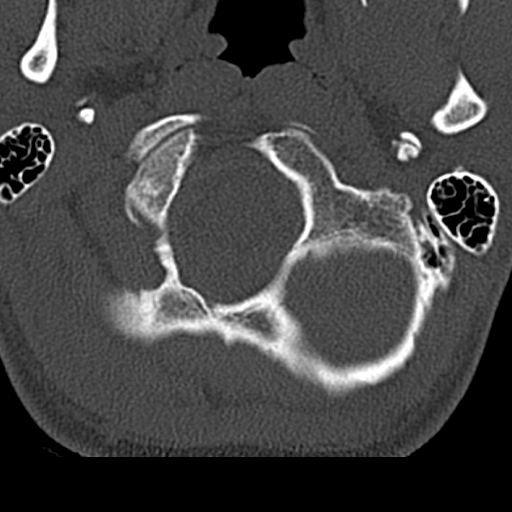

[Series 18: sagittal soft · sagittal · 0.35mm/px · 1 of 82 slices shown]
[im 41/82  bone]
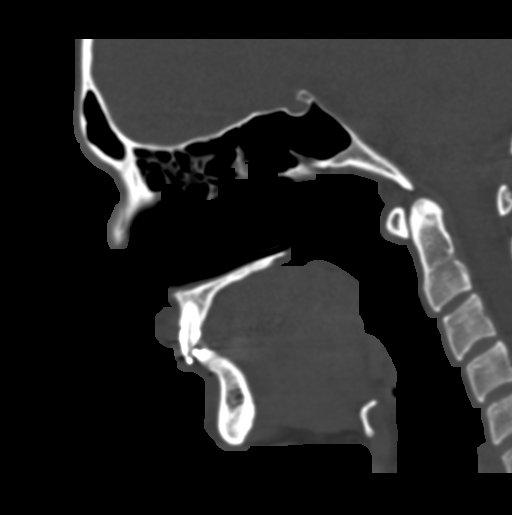

[15 of 47 positions shown; findings below may reference images not displayed]

FINDINGS: CT HEAD FINDINGS

Brain: No evidence of infarction, hemorrhage, hydrocephalus,
extra-axial collection or mass lesion/mass effect.

Vascular: Negative

Skull: Negative for fracture

CT MAXILLOFACIAL FINDINGS

Osseous: Negative for fracture or mandibular dislocation

Orbits: No evidence of injury

Sinuses: Small soft tissue density in the anterior right maxillary
sinus, usually retention cyst. There is thickening of the right
maxillary sinus wall with mild atelectasis, likely from resolved
chronic sinusitis. An ovoid lucency in the lateral wall right
maxillary sinus is well-defined benign-appearing.

Soft tissues: Contusion over the left cheek

CT CERVICAL SPINE FINDINGS

Alignment: Normal

Skull base and vertebrae: Negative for fracture

Soft tissues and spinal canal: No prevertebral fluid or swelling. No
visible canal hematoma.

Disc levels:  Negative.  No evidence of cord impingement

Upper chest: Negative
IMPRESSION: 1. No evidence of intracranial or cervical spine injury.
2. Left face contusion without fracture.
